# Patient Record
Sex: Male | Born: 1945 | Race: White | Hispanic: No | Smoking: Former smoker
Health system: Southern US, Community
[De-identification: ages and names within clinical notes are randomized; demographics above are authoritative.]

## PROBLEM LIST (undated history)

## (undated) DIAGNOSIS — J8410 Pulmonary fibrosis, unspecified: Secondary | ICD-10-CM

## (undated) DIAGNOSIS — R0609 Other forms of dyspnea: Secondary | ICD-10-CM

## (undated) DIAGNOSIS — C4490 Unspecified malignant neoplasm of skin, unspecified: Secondary | ICD-10-CM

## (undated) DIAGNOSIS — Z77090 Contact with and (suspected) exposure to asbestos: Secondary | ICD-10-CM

## (undated) DIAGNOSIS — Z942 Lung transplant status: Secondary | ICD-10-CM

## (undated) DIAGNOSIS — R0600 Dyspnea, unspecified: Secondary | ICD-10-CM

## (undated) DIAGNOSIS — C3490 Malignant neoplasm of unspecified part of unspecified bronchus or lung: Secondary | ICD-10-CM

## (undated) HISTORY — PX: LUNG TRANSPLANT: SHX234

## (undated) HISTORY — DX: Dyspnea, unspecified: R06.00

## (undated) HISTORY — DX: Contact with and (suspected) exposure to asbestos: Z77.090

## (undated) HISTORY — DX: Other forms of dyspnea: R06.09

## (undated) HISTORY — PX: OTHER SURGICAL HISTORY: SHX169

---

## 2001-05-17 ENCOUNTER — Ambulatory Visit (HOSPITAL_COMMUNITY): Admission: RE | Admit: 2001-05-17 | Discharge: 2001-05-17 | Payer: Self-pay | Admitting: Internal Medicine

## 2001-05-17 ENCOUNTER — Encounter: Payer: Self-pay | Admitting: Internal Medicine

## 2002-08-05 ENCOUNTER — Encounter: Payer: Self-pay | Admitting: Internal Medicine

## 2002-08-05 ENCOUNTER — Ambulatory Visit (HOSPITAL_COMMUNITY): Admission: RE | Admit: 2002-08-05 | Discharge: 2002-08-05 | Payer: Self-pay | Admitting: Internal Medicine

## 2004-06-27 ENCOUNTER — Ambulatory Visit: Payer: Self-pay | Admitting: Internal Medicine

## 2005-06-10 ENCOUNTER — Ambulatory Visit: Payer: Self-pay | Admitting: Internal Medicine

## 2005-06-25 ENCOUNTER — Ambulatory Visit (HOSPITAL_COMMUNITY): Admission: RE | Admit: 2005-06-25 | Discharge: 2005-06-25 | Payer: Self-pay | Admitting: Urology

## 2005-06-25 ENCOUNTER — Encounter (INDEPENDENT_AMBULATORY_CARE_PROVIDER_SITE_OTHER): Payer: Self-pay | Admitting: Specialist

## 2005-06-25 ENCOUNTER — Ambulatory Visit (HOSPITAL_BASED_OUTPATIENT_CLINIC_OR_DEPARTMENT_OTHER): Admission: RE | Admit: 2005-06-25 | Discharge: 2005-06-25 | Payer: Self-pay | Admitting: Urology

## 2006-04-21 ENCOUNTER — Ambulatory Visit: Payer: Self-pay | Admitting: Internal Medicine

## 2006-04-27 ENCOUNTER — Ambulatory Visit: Payer: Self-pay | Admitting: Internal Medicine

## 2006-05-08 ENCOUNTER — Ambulatory Visit: Payer: Self-pay | Admitting: Internal Medicine

## 2006-05-25 ENCOUNTER — Ambulatory Visit: Payer: Self-pay | Admitting: Internal Medicine

## 2006-05-25 ENCOUNTER — Encounter (INDEPENDENT_AMBULATORY_CARE_PROVIDER_SITE_OTHER): Payer: Self-pay | Admitting: Specialist

## 2006-12-16 ENCOUNTER — Ambulatory Visit: Payer: Self-pay | Admitting: Internal Medicine

## 2006-12-16 LAB — CONVERTED CEMR LAB: Vitamin B-12: >1500 pg/mL — ABNORMAL HIGH (ref 211–911)

## 2006-12-29 ENCOUNTER — Ambulatory Visit: Payer: Self-pay | Admitting: Internal Medicine

## 2006-12-29 LAB — CONVERTED CEMR LAB
BUN: 15 mg/dL (ref 6–23)
Creatinine, Ser: 1.2 mg/dL (ref 0.4–1.5)

## 2006-12-30 ENCOUNTER — Ambulatory Visit: Payer: Self-pay | Admitting: Cardiology

## 2007-01-19 ENCOUNTER — Ambulatory Visit: Payer: Self-pay | Admitting: Internal Medicine

## 2007-01-21 ENCOUNTER — Ambulatory Visit: Payer: Self-pay | Admitting: Pulmonary Disease

## 2007-05-12 ENCOUNTER — Ambulatory Visit (HOSPITAL_BASED_OUTPATIENT_CLINIC_OR_DEPARTMENT_OTHER): Admission: RE | Admit: 2007-05-12 | Discharge: 2007-05-12 | Payer: Self-pay | Admitting: Urology

## 2007-05-12 ENCOUNTER — Encounter (INDEPENDENT_AMBULATORY_CARE_PROVIDER_SITE_OTHER): Payer: Self-pay | Admitting: Urology

## 2007-05-18 DIAGNOSIS — Z77090 Contact with and (suspected) exposure to asbestos: Secondary | ICD-10-CM | POA: Insufficient documentation

## 2007-05-18 DIAGNOSIS — R0609 Other forms of dyspnea: Secondary | ICD-10-CM | POA: Insufficient documentation

## 2007-05-18 DIAGNOSIS — R0989 Other specified symptoms and signs involving the circulatory and respiratory systems: Secondary | ICD-10-CM

## 2007-07-13 ENCOUNTER — Ambulatory Visit: Payer: Self-pay | Admitting: Pulmonary Disease

## 2008-12-20 ENCOUNTER — Encounter: Payer: Self-pay | Admitting: Pulmonary Disease

## 2009-07-16 ENCOUNTER — Encounter: Payer: Self-pay | Admitting: Pulmonary Disease

## 2009-07-19 ENCOUNTER — Encounter: Payer: Self-pay | Admitting: Pulmonary Disease

## 2009-08-09 ENCOUNTER — Ambulatory Visit: Payer: Self-pay | Admitting: Pulmonary Disease

## 2009-08-09 DIAGNOSIS — J8410 Pulmonary fibrosis, unspecified: Secondary | ICD-10-CM

## 2009-08-21 ENCOUNTER — Ambulatory Visit: Payer: Self-pay | Admitting: Pulmonary Disease

## 2009-08-21 ENCOUNTER — Telehealth (INDEPENDENT_AMBULATORY_CARE_PROVIDER_SITE_OTHER): Payer: Self-pay | Admitting: *Deleted

## 2009-08-21 LAB — CONVERTED CEMR LAB
Anti Nuclear Antibody(ANA): NEGATIVE
Rheumatoid fact SerPl-aCnc: 20.7 intl units/mL — ABNORMAL HIGH (ref 0.0–20.0)
Sed Rate: 49 mm/hr — ABNORMAL HIGH (ref 0–22)

## 2009-08-29 ENCOUNTER — Encounter: Payer: Self-pay | Admitting: Pulmonary Disease

## 2009-08-29 DIAGNOSIS — G4730 Sleep apnea, unspecified: Secondary | ICD-10-CM | POA: Insufficient documentation

## 2009-09-03 ENCOUNTER — Ambulatory Visit: Payer: Self-pay | Admitting: Pulmonary Disease

## 2009-10-09 ENCOUNTER — Telehealth: Payer: Self-pay | Admitting: Pulmonary Disease

## 2010-04-19 ENCOUNTER — Ambulatory Visit: Payer: Self-pay | Admitting: Pulmonary Disease

## 2010-08-22 ENCOUNTER — Telehealth (INDEPENDENT_AMBULATORY_CARE_PROVIDER_SITE_OTHER): Payer: Self-pay | Admitting: *Deleted

## 2010-08-23 ENCOUNTER — Telehealth (INDEPENDENT_AMBULATORY_CARE_PROVIDER_SITE_OTHER): Payer: Self-pay | Admitting: *Deleted

## 2010-08-27 NOTE — Progress Notes (Signed)
Summary: returned call to Chryl Heck  Phone Note Call from Patient Call back at Work Phone [phone]   Caller: Patient Call For: alva Reason for Call: Talk to Nurse Summary of Call: pt returned call to [facility]  Initial call taken by: Eugene Gavia,  October 09, 2009 2:36 PM  Follow-up for Phone Call        pt informe of RA recs. Zackery Barefoot CMA  October 09, 2009 2:40 PM

## 2010-08-27 NOTE — Assessment & Plan Note (Signed)
Summary: APENA LINK ONLY NOT AN APPT/CB   Allergies: No Known Drug Allergies   Other Orders: EMR Misc Charge Code (EMRMisc)  Appended Document: APENA LINK ONLY NOT AN APPT/CB let him know sleep study showed mild obstructive sleep apnea, no treatment at this time, if remains symptomatic, will pursue in lab study in the future.  Appended Document: APENA LINK ONLY NOT AN APPT/CB LMOMTCB x 1--jwr  Appended Document: APENA LINK ONLY NOT AN APPT/CB LMOMTCB x 2--jwr  Appended Document: APENA LINK ONLY NOT AN APPT/CB Pt informed and stated is currently not having any problems. jwr

## 2010-08-27 NOTE — Miscellaneous (Signed)
Summary: Orders Update pft charges  Clinical Lists Changes  Orders: Added new Service order of Carbon Monoxide diffusing w/capacity (94720) - Signed Added new Service order of Lung Volumes (94240) - Signed Added new Service order of Spirometry (Pre & Post) (94060) - Signed 

## 2010-08-27 NOTE — Assessment & Plan Note (Signed)
Summary: asbestosis exposure/in [facility] office/apc   Primary Provider/Referring Provider:  Dr Garner Nash, [facility]  CC:  Follow up.  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  Marland Kitchen  History of Present Illness: 65 year old Caucasian gentleman who is referred for an abnormal CT scan.  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  A followup CT scan showed a peripheral pattern of subpleural fibrosis and mild honeycombing bilaterally.  There did not seem to be any bibasilar predominance.   Mediastinal lymph nodes were increased in number and measured up to 1.1 cm in the AP window.  I do note a chest x-ray dating back to November 2007 which also shows some foci of fibrotic changes bilaterally.  The peripheral pattern of pulmonary fibrosis can be seen with idiopathic pulmonary fibrosis [usual interstitial pneumonia]. However, there does not seem to be any basal predominance.  The enlarged mediastinal lymph nodes and scattered areas of scarring would also be consistent with asbestos exposure.  There is no evidence of pleural calcification or thickening.  Pulmonary function tests show no evidence of airway obstruction.  His lung volumes are preserved.  Diffusion capacity is moderately decreased to 68%, but corrects for alveolar volume.  This would again be consistent with very early interstitial lung disease.  The honeycombing suggests chronic nature, and radiographically the changes seem to date back at least to November2007.  A pragmatic approach would be to repeat pulmonary function tests and imaging in about six months' time.   August 09, 2009 4:23 PM  [name] to [facility] med for cough & congestion, CXR showed diffuse pulmonary infx. CT chest ([facility]) on 07/26/09 showed diffuse ILD (upper & lower lobes) & again NO calcified pleural  plaques. Reviewed CMET , BUN /cr 20/1.2, LFTs nml, PPD neg. Dyspnea improved, mild dry cough, Dyspnea persists ,may be slightly worse, desaturated to 91% on exertion  Preventive Screening-Counseling & Management  Alcohol-Tobacco     Smoking Status: quit  Current Medications (verified): 1)  Cholesterol Medicine .... 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 pancreatic cancer-father heart attack-brother  Social History: Reviewed history and no changes required. Patient states former smoker.  quit in 1984.  1 1/4 ppd x96yrs alcohol occasionally married 2 children, 2 dogs , no birds Semi-Retired from [employer].  Owns a Administrator, sports business.  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  The patient denies shortness of breath at rest, non-productive cough, coughing up blood, chest pain, irregular heartbeats, acid heartburn, indigestion, loss of appetite, weight change, abdominal pain, difficulty swallowing, sore throat, tooth/dental problems, headaches, nasal congestion/difficulty breathing through nose, sneezing, ear ache, anxiety, depression, hand/feet swelling, rash, change in color of mucus, and fever.    Vital Signs:  Patient profile:   65 year old male Height:      72 inches Weight:      212 pounds BMI:     28.86 O2 Sat:      97 %  on Room air Temp:     98.2 degrees F oral Pulse rate:   96 / minute BP sitting:   124 / 80  (left arm) Cuff size:   regular  Vitals Entered By: Gweneth Dimitri RN (August 09, 2009 4:14 PM)  O2 Flow:  Room air CC: Follow up.  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   Comments Medications reviewed with patient Gweneth Dimitri RN  August 09, 2009 4:15 PM  Ambulatory Pulse Oximetry  Resting; HR_76____    02 Sat_95%ra____  Lap1 (185 feet)   HR_103____   02 Sat__92%ra___ Lap2 (185 feet)   HR_107____   02 Sat_91%ra____    Lap3 (185 feet)   HR__104___   02 Sat_92%ra____  _x__Test Completed without Difficulty ___Test Stopped due to:  Gweneth Dimitri RN  August 09, 2009 5:13 PM     Physical Exam  Additional Exam:  Gen. Pleasant, well-nourished, in no distress, normal affect ENT - no lesions, no post nasal drip Neck: No JVD, no thyromegaly, no carotid bruits Lungs: no use of accessory muscles, no dullness to percussion, Lt basal  rales, no rhonchi  Cardiovascular: Rhythm regular, heart sounds  normal, no murmurs or gallops, no peripheral edema Abdomen: soft and non-tender, no hepatosplenomegaly, BS normal. Musculoskeletal: No deformities, no cyanosis or clubbing Neuro:  alert, non focal     Impression & Recommendations:  Problem # 1:  PULMONARY FIBROSIS (ICD-515) Unclear if this has worsened in the last 2 years the he was lost to FU. Will rpt PFTs to get obkjective comparison. Obtain ANA, ESR, RA factor & ACE level on next visit. if worse, may have to pursue biopsy  Medications Added to Medication List This Visit: 1)  Cholesterol Medicine  .... Once daily  Other Orders: Pulmonary Referral (Pulmonary) Est. Patient Level IV (16109)  Patient Instructions: 1)  Please schedule a follow-up appointment after your breathing test. 2)  You have been asked to make an appointment for a Pulmonary Function Test (Breathing Test) prior to or at the time of your next visit. Use medications as usual unless otherwise instructed.  Appended Document: asbestosis exposure/in [facility] office/[facility] pl leave message for radiology to compare his CT from [facility] (they have access) to  his old CT from 2 yrs ago - re : degree of fibrosis  Appended Document: asbestosis exposure/in [facility] office/apc The numbers I have for Radiology are [phone] and [id number].jwr  Appended Document: asbestosis exposure/in [facility] office/apc fibrosis appears worse - on comparison per radiologist, await PFTs

## 2010-08-27 NOTE — Assessment & Plan Note (Signed)
Summary: follow up with PFTs  // cj   Primary Provider/Referring Provider:  Dr Garner Nash, [facility]  CC:  Pt here for follow-up to discuss PFT results. Pt has no new comlaints at this time. Marland Kitchen  History of Present Illness: 63/M, ex smoker  referred for an abnormal CT scan.  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  There did not seem to be any bibasilar predominance.   Mediastinal lymph nodes were increased in number and measured up to 1.1 cm in the AP window.  I do note a chest x-ray dating back to November 2007 which also shows some foci of fibrotic changes bilaterally.  The peripheral pattern of pulmonary fibrosis can be seen with idiopathic pulmonary fibrosis [usual interstitial pneumonia]. However, there does not seem to be any basal predominance.  The enlarged mediastinal lymph nodes and scattered areas of scarring would also be consistent with asbestos exposure.  There is no evidence of pleural calcification or thickening.  Pulmonary function tests show no evidence of airway obstruction.  His lung volumes are preserved.  Diffusion capacity is moderately decreased to 68%, but corrects for alveolar volume.  This would again be consistent with very early interstitial lung disease.  The honeycombing suggests chronic nature, and radiographically the changes seem to date back at least to November2007.   August 09, 2009 4:23 PM  Micah Flesher to [facility] med for cough & congestion, CXR showed diffuse pulmonary infx. CT chest ([facility]) on 07/26/09 showed diffuse ILD (upper & lower lobes) & again NO calcified pleural plaques. Reviewed CMET , BUN /cr 20/1.2, LFTs nml, PPD neg. Dyspnea improved, mild dry cough, Dyspnea persists ,may be slightly worse, desaturated to 91% on exertion  August 21, 2009 2:37 PM  Reviewed CT with radiologist,  fibrosis appears worse. PFTs however unchanged, preserved lung volumes & DLCO at 68%. Wife reports loud snoring. ESR 49, RA factor pos, ANA neg, Wheezing & congestion persists  Current Medications (verified): 1)  Simvastatin 40 Mg Tabs (Simvastatin) .... Take 1 Tablet By Mouth Once A Day  Allergies (verified): No Known Drug Allergies  Past History:  Past Medical History: Last updated: 08/09/2009 DYSPNEA ON EXERTION (ICD-786.09) HX, PERSONAL, EXPOSURE TO ASBESTOS (ICD-V15.84)  Social History: Last updated: 08/09/2009 Patient states former smoker.  quit in 1984.  1 1/4 ppd x30yrs alcohol occasionally married 2 children, 2 dogs , no birds Semi-Retired from [employer].  Owns a Administrator, sports business.  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  The patient denies anorexia, fever, weight loss, weight gain, vision loss, decreased hearing, hoarseness, chest pain, syncope, peripheral edema, prolonged cough, headaches, hemoptysis, abdominal pain, melena, hematochezia, severe indigestion/heartburn, hematuria, muscle weakness, difficulty walking, depression, unusual weight change, and abnormal bleeding.    Vital Signs:  Patient profile:   65 year old male Height:      72 inches Weight:      208 pounds O2 Sat:      95 % on Room air Temp:     97.6 degrees F oral Pulse rate:   80 / minute BP sitting:   118 / 70  (right arm) Cuff size:   regular  Vitals Entered By: Carron Curie CMA (August 21, 2009 2:02 PM)  O2  Flow:  Room air CC: Pt here for follow-up to discuss PFT results. Pt has no new comlaints at this time.  Comments Medications reviewed with patient Carron Curie CMA  August 21, 2009 2:02 PM Daytime phone number verified with patient.    Physical Exam  Additional Exam:  Gen. Pleasant, well-nourished, in no distress, normal affect ENT - no lesions, no post  nasal drip Neck: No JVD, no thyromegaly, no carotid bruits Lungs: no use of accessory muscles, no dullness to percussion, Lt basal  rales, no rhonchi  Cardiovascular: Rhythm regular, heart sounds  normal, no murmurs or gallops, no peripheral edema Abdomen: soft and non-tender, no hepatosplenomegaly, BS normal. Musculoskeletal: No deformities, no cyanosis or clubbing      Impression & Recommendations:  Problem # 1:  PULMONARY FIBROSIS (ICD-515) While CT appears slightly worse, PFTs are unchanged . As such, I doubt biopsy necessary here at this point. Radilogic pattern is not typical of idiopathic pulmonary fibrosis. Would be consistent with asbestosis but absence of pleurla plaques is surprising. Chk. ANa, ESR, Ra factor  Problem # 2:  DYSPNEA ON EXERTION (ICD-786.09) acute bronchitis superimposed on fibrosis Short course of prednisone. Orders: Est. Patient Level IV (73220)  Problem # 3:  OBSTRUCTIVE SLEEP APNEA (ICD-780.57) [place] study given loud snoring & witnessed apneas by wife. The pathophysiology of obstructive sleep apnea, it's cardiovascular consequences and modes of treatment including CPAP were discussed with the patient in great detail.   Medications Added to Medication List This Visit: 1)  Simvastatin 40 Mg Tabs (Simvastatin) .... Take 1 tablet by mouth once a day 2)  Tessalon 200 Mg Caps (Benzonatate) .... Three times a day as needed 3)  Prednisone 10 Mg Tabs (Prednisone) .... Once daily x 10 days then 5 mg x 10 days  Other Orders: T-Antinuclear Antib (ANA) [phone]) Sleep Disorder Referral (Sleep Disorder) TLB-Rheumatoid Factor (RA) (62831-DV) TLB-Sedimentation Rate (ESR) (85652-ESR) Prescription Created Electronically [phone])  Patient Instructions: 1)  Copy sent to: 2)  Please schedule a follow-up appointment in 3 months. 3)  Blood work today 4)  Prednisone course 5)  Tessalon perels three times a day as needed for cough 6)  RObitussin DM OTC as needed   7)  Portable sleep study Prescriptions: PREDNISONE 10 MG TABS (PREDNISONE) once daily x 10 days then 5 mg x 10 days  #20 x 0   Entered and Authorized by:   Comer Locket Vassie Loll MD   Signed by:   Comer Locket Vassie Loll MD on 08/21/2009   Method used:   Electronically to        [facility] [phone]* (retail)       [address]       [place], [place]  [address]       Ph: [phone] or [phone]       Fax: [phone]   RxID:   [id number] TESSALON 200 MG CAPS (BENZONATATE) three times a day as needed  #60 x 1   Entered and Authorized by:   Comer Locket. Vassie Loll MD   Signed by:   Comer Locket Vassie Loll MD on 08/21/2009   Method used:   [facility]ronically to        [facility] [phone]* (retail)       [address]       [place], [place]  [address]       Ph: [phone] or [phone]  Fax: [phone]   RxID:   [id number]     Appended Document: follow up with PFTs  // cj arrange FU OV pl.  Appended Document: follow up with PFTs  // cj Left message with pt's spouse to have pt call back to schedule follow up. jwr  Appended Document: follow up with PFTs  // cj pt scheduled appointment 04-19-10 at 9:30am. jwr

## 2010-08-27 NOTE — Progress Notes (Signed)
Summary: rx  Phone Note From Pharmacy Call back at [phone]   Call[facility] [phone]* Call For: alva  Summary of Call: have a question re: prednisone qty. Initial call taken by: Eugene Gavia,  August 21, 2009 3:49 PM  Follow-up for Phone Call        Spoke with pharmacy and made aware to give # 15 tablets of prednisone. Follow-up by: Vernie Murders,  August 21, 2009 3:52 PM

## 2010-08-27 NOTE — Assessment & Plan Note (Signed)
Summary: per pt call/mhh   Visit Type:  Follow-up Primary Provider/Referring Provider:  Dr Garner Nash, [facility]  CC:  Pt here for follow up. Pt states not complaints.  History of Present Illness: 63/M, ex smoker  referred for an abnormal CT scan.  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  There did not seem to be any bibasilar predominance. Mediastinal lymph nodes were increased in number and measured up to 1.1 cm in the AP window.  I do note a chest x-ray dating back to November 2007 which also shows some foci of fibrotic changes bilaterally.   The enlarged mediastinal lymph nodes and scattered areas of scarring would also be consistent with asbestos exposure.  There is no evidence of pleural calcification or thickening.  Pulmonary function tests show no evidence of airway obstruction.  His lung volumes are preserved.  Diffusion capacity is moderately decreased to 68%, but corrects for alveolar volume.  This would again be consistent with very early interstitial lung disease.  The honeycombing suggests chronic nature, and radiographically the changes seem to date back at least to November2007.   August 09, 2009 4:23 PM  [name] to [facility] med for cough & congestion, CXR showed diffuse pulmonary infx. CT chest ([facility]) on 07/26/09 showed diffuse ILD (upper & lower lobes) & again NO calcified pleural plaques. Reviewed CMET , BUN /cr 20/1.2, LFTs nml, PPD neg. Dyspnea improved, mild dry cough, Dyspnea persists ,may be slightly worse, desaturated to 91% on exertion  August 21, 2009 2:37 PM  Reviewed CT with radiologist, fibrosis appears worse. PFTs however unchanged, preserved lung volumes & DLCO at 68%. Wife reports loud snoring. ESR 49, RA factor pos, ANA neg, Wheezing & congestion persists Apnealink >> mild obstructive  sleep apnea with [facility] 5/h  April 19, 2010 9:50 AM  Feels well, no dyspnea, cough - discussed study for IPF - he does not want to enroll/ be screened at this time. Had reaction fro flu shot 40 yrs ago but agreeable to retry  Preventive Screening-Counseling & Management  Alcohol-Tobacco     Alcohol drinks/day: <1     Alcohol type: beer/wine     Smoking Status: quit     Packs/Day: 1.5     Year Started: 1960     Year Quit: 1983  Current Medications (verified): 1)  None  Allergies (verified): No Known Drug Allergies  Past History:  Past Medical History: Last updated: 08/09/2009 DYSPNEA ON EXERTION (ICD-786.09) HX, PERSONAL, EXPOSURE TO ASBESTOS (ICD-V15.84)  Social History: Last updated: 08/09/2009 Patient states former smoker.  quit in 1984.  1 1/4 ppd x55yrs alcohol occasionally married 2 children, 2 dogs , no birds Semi-Retired from [employer].  Owns a Administrator, sports business.  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    Vital Signs:  Patient profile:   65 year old male Height:      72 inches Weight:      217.38 pounds BMI:  29.59 O2 Sat:      97 % on Room air Temp:     98.1 degrees F oral Pulse rate:   63 / minute BP sitting:   116 / 60  (left arm) Cuff size:   large  Vitals Entered By: [name] CMA (April 19, 2010 9:44 AM)  O2 Flow:  Room air CC: Pt here for follow up. Pt states not  complaints Comments Medications reviewed with patient Verified contact number and pharmacy with patient [name] CMA  April 19, 2010 9:45 AM    Physical Exam  Additional Exam:  Gen. Pleasant, well-nourished, in no distress, normal affect ENT - no lesions, no post nasal drip Neck: No JVD, no thyromegaly, no carotid bruits Lungs: no use of accessory muscles, no dullness to percussion, Lt basal  rales, no rhonchi  Cardiovascular: Rhythm regular, heart sounds  normal, no murmurs or gallops, no peripheral edema Musculoskeletal: No deformities, no cyanosis or clubbing      CXR  Procedure date:  04/19/2010  Findings:      Comparison: CT dated 12/30/2006   Findings: The lung volumes are low normal.  There is peripheral reticulation and honeycombing seen in the lung periphery involving the lung apices slightly more than the lung bases.  No areas of consolidation are seen.  There are no pleural effusions.  The overall pattern is similar to slightly progressed since the plain film exam of 2007.   IMPRESSION: Unchanged chronic interstitial lung disease without acute findings.  Impression & Recommendations:  Problem # 1:  PULMONARY FIBROSIS (ICD-515) Lung function appears preserved - will monitor annually. CXR today  Does not want to enroll in IPF study/ screen flu shot  Other Orders: Est. Patient Level III (04540) Pulmonary Referral (Pulmonary) Influenza Vaccine MCR (98119) T-2 View CXR (71020TC)  Patient Instructions: 1)  Copy sent to: Dr Garner Nash 2)  Please schedule a follow-up appointment in 6 months. 3)  You have been asked to make an appointment for a Pulmonary Function Test (Breathing Test) prior to or at the time of your next visit. Use medications as usual unless otherwise instructed. 4)  Flu shot 5)  A chest x-ray has been recommended.  Your imaging study may require preauthorization.    Immunization History:  Pneumovax Immunization History:     Pneumovax:  historical (09/03/2009)  Zostavax History:    Zostavax # 1:  zostavax (07/28/2005)  Immunizations Administered:  Influenza Vaccine # 1:    Vaccine Type: Fluvax MCR    Site: left deltoid    Mfr: GlaxoSmithKline    Dose: 0.5 ml    Route: IM    Given by: [name] CMA    Exp. Date: 01/25/2011    Lot #: [id number]    VIS given: 02/19/10 version given April 19, 2010.  Flu Vaccine Consent Questions:    Do you have a history of severe allergic reactions to this vaccine? no    Any prior history of allergic reactions to egg and/or gelatin? no    Do you have a sensitivity to the preservative Thimersol? no    Do you have a past history of Guillan-Barre Syndrome? no    Do you currently have an acute febrile illness? no    Have you ever had a severe reaction to latex? no    Vaccine information given and explained to patient? yes

## 2010-08-27 NOTE — Procedures (Signed)
Summary: ApneaLink  ApneaLink   Imported By: [name] 09/18/2009 10:11:26  _____________________________________________________________________  External Attachment:    Type:   Image     Comment:   External Document

## 2010-08-29 NOTE — Progress Notes (Signed)
Summary: prescription<<<Prednisone RX called to pharmacy  Phone Note Call from Patient   Caller: Patient Call For: [name] Summary of Call: Patient phoned stated that Dr. Vassie Loll has given him prednisone in the past for coughing and congestion. Patient is having the same symptons he has been taking cold and sinus medicine for 3 to 5 days and it isnt helping. Patient wanted to know if Dr. Vassie Loll will [facility] [place]. His wife has been diagnosised with cancer and he doesnt want to get her sick. Patient can be reached at [phone] Initial call taken by: Vedia Coffer,  August 22, 2010 11:48 AM  Follow-up for Phone Call        Spoke with pt.  He is c/o dry "constant cough" x 5 days- states that his breathing is fine, denies wheeze. Would like rx for prednisone.  Will have to forward to doc of the day since RA not in.  Pls advise CY, thanks NKA Follow-up by: Vernie Murders,  August 22, 2010 3:05 PM  Additional Follow-up for Phone Call Additional follow up Details #1::        Per CDY-okay to give Prednisone 10mg  #20 take 4 x 2 days, 3 x 2 days, 2 x 2 days, 1 x 2 days then stop. no refills.Reynaldo Minium CMA  August 22, 2010 4:25 PM   Called, spoke with pt.  States he did pick up pred taper last night and it is starting to help.  He was informed if he starts coughing up green or yellow phelgm to call office back for an abx.  He vebalized understanding of this. Additional Follow-up by: Gweneth Dimitri RN,  August 23, 2010 12:09 PM    Additional Follow-up for Phone Call Additional follow up Details #2::    Pt is aware of Prednisone RX and to call if his sxs do not improve or get worse. I will forward to RA so he is aware.Michel Bickers CMA  August 22, 2010 5:08 PM   OK. If develops green/ yellow phlegm , have him call back for antibiotic RX Follow-up by: Comer Locket. Vassie Loll MD,  August 23, 2010 11:58 AM  New/Updated Medications: PREDNISONE 10 MG TABS (PREDNISONE) 4 by mouth x2  days, 3 by mouth x2 days, 2 by mouth x2 days, 1 by mouth x2 days then stop. Prescriptions: PREDNISONE 10 MG TABS (PREDNISONE) 4 by mouth x2 days, 3 by mouth x2 days, 2 by mouth x2 days, 1 by mouth x2 days then stop.  #20 x 0   Entered by:   Michel Bickers CMA   Authorized by:   Waymon Budge MD   Signed by:   Michel Bickers CMA on 08/22/2010   Method used:   [facility]ically to        [facility]. [phone]* (retail)       [address]       [place], [place]  [address]       Ph: [phone]       Fax: [phone]   RxID:   [id number]

## 2010-08-29 NOTE — Progress Notes (Signed)
Summary: would like antibiotic called in   Phone Note Call from Patient   Caller: Patient Call For: DR ALVA Summary of Call: Patient phoned stated he spoke to the nurse yesterday and he was called in some prednisone but today he is coughing up a greenish mucus. He stated that he was told yesterday if he started coughing up mucus the office would call him in an antibiotic. [facility] [place] [facility] [address]. Patient can be reached at [phone] Initial call taken by: Vedia Coffer,  August 23, 2010 3:25 PM  Follow-up for Phone Call        Per phone note from yesterday, per RA - If develops green/ yellow phlegm , have him call back for antibiotic RX LMOMCTB Crystal Jones RN  August 23, 2010 4:27 PM  Pt returned call.  States he is coughing up greenish yellow mucus - onset this am.  Low grade fever last night 99-100.  Will forward to RA -- pls advie if you would like to give pt abx.    [facility] [place] [facility]  Follow-up by: Gweneth Dimitri RN,  August 23, 2010 4:47 PM  Additional Follow-up for Phone Call Additional follow up Details #1::        avelox 400 once daily x 7  If no b etter by next wk ,arrange OV Additional Follow-up by: Comer Locket. Vassie Loll MD,  August 23, 2010 5:11 PM    Additional Follow-up for Phone Call Additional follow up Details #2::    Spoke with pt and notified that rx was sent for avelox and to call for ov next wk if not improving.  Pt verbalized understanding.  Follow-up by: Vernie Murders,  August 23, 2010 5:16 PM  New/Updated Medications: AVELOX 400 MG TABS (MOXIFLOXACIN HCL) once daily Prescriptions: AVELOX 400 MG TABS (MOXIFLOXACIN HCL) once daily  #7 x 0   Entered and Authorized by:   Comer Locket Vassie Loll MD   Signed by:   Comer Locket Vassie Loll MD on 08/23/2010   Method used:   Electronically to        [facility]. [phone]* (retail)       [address]       [place], [place]  [address]       Ph: [phone]       Fax: [phone]  RxID:   [id number]

## 2010-12-10 NOTE — Op Note (Signed)
NAME:  [name], [name] NO.:  [id number]   MEDICAL RECORD NO.:  [id number]          PATIENT TYPE:  AMB   LOCATION:  [facility]                         FACILITY:  [facility]   PHYSICIAN:  Maretta Bees. Vonita Moss, M.D.DATE OF BIRTH:  1945/08/20   DATE OF PROCEDURE:  DATE OF DISCHARGE:                               OPERATIVE REPORT   PREOPERATIVE DIAGNOSIS:  Meatal stricture, urethral stricture and  chronic balanitis.   POSTOPERATIVE DIAGNOSIS:  Meatal stricture, urethral stricture and  chronic balanitis.   PROCEDURE:  Cystoscopy, urethral dilation and biopsy of glans penis.   SURGEON:  Dr. Larey Dresser   ANESTHESIA:  General.   INDICATIONS:  This 65 year old gentleman has had a past history of  chronic balanitis and has had a previous biopsy showing no evidence of  malignancy of the glans penis.  He has urethral stricture disease and he  saw Dr. Vernie Ammons on September 29 and he had an increased residual urine  and had dilation only up to 14 [place] and small Foley catheter was put  in.  I removed that the other day because it was uncomfortable for him  and he said he voided somewhat better but still has significant disease.   PROCEDURE IN DETAIL:  The patient was brought to the operating room,  placed in the lithotomy position.  External genitalia were prepped and  draped in the usual fashion.  He had some dry, somewhat indurated,  leathery skin on the glans penis without a definitive lesion, erythema  or ulceration.  His meatus was tight to 16-French sound and he was a  dilated to 28-French.  I then inserted cystoscope and saw some proximal  stricture disease and dilated him up to 24-French.  I then inserted the  cystoscope further and noted that he has pretty much panurethral disease  that gets better in the deep bulb.  Prostate has small lateral lobes.  Bladder was trabeculated but no intravesical lesions.  A 20-French Foley  was then inserted and connected subsequently to  closed drainage.   I then did a wedge biopsy of the glans penis to the right of the meatus  and used needle electrode to fulgurate the base and close this with  interrupted 4-0 chromic catgut.  The patient was then taken to the  recovery room in good condition with blood loss of 5 mL or less.  He  tolerated the procedure well, was taken to recovery room.   I am going to see him in followup and it may be appropriate for him to  undergo evaluation for urethroplasty or a stent and we will see what his  biopsy shows of the penis.  His last biopsy showed benign lentigo.      Maretta Bees. Vonita Moss, M.D.  Electronically Signed     LJP/MEDQ  D:  05/12/2007  T:  05/13/2007  Job:  [id number]

## 2010-12-10 NOTE — Letter (Signed)
January 21, 2007    Rosalyn Gess. Norins, MD  [address]  [place], [place]  [address]   RE:  [name], [name]  MRN:  [id number]  /  DOB:  06/11/1946   Dear Dr. Debby Bud:   As you are aware Mr. Ponder is a 65 year old Caucasian gentleman who  is referred for an abnormal CT scan.  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  A followup CT scan showed a peripheral  pattern of subpleural fibrosis and mild honeycombing bilaterally.  There  did not seem to be any bibasilar predominance.  I have reviewed these  films.  Mediastinal lymph nodes were increased in number and measured up  to 1.1 cm in the AP window.  Hence we are consulted.  I do note a chest  x-ray dating back to November 2007 which also shows some foci of  fibrotic changes bilaterally.   The patient currently denies dyspnea, cough, wheezing or chest pain.   PAST MEDICAL HISTORY:  Traumatic rib fracture after a fall in 2001 with  left pneumothorax requiring a chest tube.   PAST SURGICAL HISTORY:  None.   ALLERGIES:  None.   CURRENT MEDICATIONS:  None.   SOCIAL HISTORY:  He quit smoking in 1983.  Smoked about a pack and a  half per day for 15 years.  He drinks socially.  He is married and lives  with his family.  In his job he has been exposed to asbestos pipes.  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   FAMILY HISTORY:  His father died of intestinal cancer.   REVIEW OF SYSTEMS:  Denies dyspnea, cough, weight loss, fevers.   PHYSICAL EXAMINATION:  VITAL SIGNS:  Weight 214 pounds, afebrile, blood  pressure 110/82, heart rate 62, oxygen saturation 95% on room air.  HEENT:  Normal.  NECK:  Supple.  No JVD.  No lymphadenopathy.  CV:  S1, S2 normal.  CHEST:  Clear to auscultation.  ABDOMEN:  Soft, nontender.  EXTREMITIES:  No edema.   IMAGING:  I have reviewed his  films.   IMPRESSION/PLAN:  The peripheral pattern of pulmonary fibrosis can be  seen with idiopathic pulmonary fibrosis [usual interstitial pneumonia].  However, there does not seem to be any basal predominance.  The enlarged  mediastinal lymph nodes and scattered areas of scarring would also be  consistent with asbestos exposure.  There is no evidence of pleural  calcification or thickening.  Pulmonary function tests show no evidence  of airway obstruction.  His lung volumes are preserved.  Diffusion  capacity is moderately decreased to 68%, but corrects for alveolar  volume.  This would again be consistent with very early interstitial  lung disease.  The honeycombing suggests chronic nature, and  radiographically the changes seem to date back at least to November  2007.   I discussed the above findings with the patient.  At this point I think  it is too early to intervene, and I doubt that even a surgical lung  biopsy would have any yield.  Again these may be all consistent with his  history of asbestos exposure in which case I would not expect any  worsening.  A pragmatic approach would be to repeat pulmonary function  tests and imaging in about six months' time.  If there is  any  deterioration we will consider biopsy. If not, we will just take a wait  and watch approach.  We will keep you informed as to his progress.    Sincerely,      Oretha Milch, MD  Electronically Signed    RVA/MedQ  DD: 01/21/2007  DT: 01/21/2007  Job #: [id number]

## 2010-12-13 NOTE — Assessment & Plan Note (Signed)
[facility]                             PRIMARY CARE OFFICE NOTE   NAME:[name], [name]                   MRN:          [id number]  DATE:04/27/2006                            DOB:          11-01-1945    Mr. [name] is a 65 year old gentleman who presents for routine follow-up  evaluation and exam.  I last saw this patient on Dec 26, 2003.  Please see  that complete dictation for past medical history, family history, and social  history.   In the interval, patient was seen on June 10, 2005 by Dr. Artist Pais for  urethritis and question of meatal obstruction.  He was subsequently referred  to Dr. Larey Dresser, who then took the patient to the operating room on  June 25, 2005 for a cystoscopy, urethral meatotomy, urethral dilatation,  and biopsy of the glans penis.  Since that time, the patient has had one  episode of infection but otherwise has done well.   CURRENT MEDICATIONS:  No prescription drugs.  Patient takes occasional  ibuprofen.   REVIEW OF SYSTEMS:  Negative for any constitutional problems.  It has been  greater than 18 months since he had my exam.  Patient does have an upper  bridge but no other dental problems.  No cardiovascular, respiratory  complaints.  No GI problems.  GU is per Dr. Vonita Moss and stable.  No  musculoskeletal complaints except for mild arthralgias.  No dermatologic or  neurologic problems.   PHYSICAL EXAMINATION:  VITAL SIGNS:  Temperature was 98.2, blood pressure  122/76, pulse 69, weight 200 pounds.  GENERAL APPEARANCE:  This is a well-developed and well-nourished gentleman  in no acute distress.  HEENT:  Normocephalic and atraumatic.  EACs and TM's were normal.  Oropharynx with native dentition at good repair.  No buccal or palatal  lesions were noted.  Posterior pharynx was clear.  Conjunctivae and sclerae  are clear.  PERRLA.  EOMI.  Funduscopic exam was unremarkable.  NECK:  Supple without  thyromegaly.  NODES:  No adenopathy was noted in the cervical and supraclavicular regions.  CHEST:  No CVA tenderness.  LUNGS:  Clear to auscultation and percussion.  CARDIOVASCULAR:  Radial pulses 2+.  No JVD or carotid bruits.  He had a  quiet precordium with a regular rate and rhythm without murmurs, rubs or  gallops.  ABDOMEN:  Soft.  No guarding or rebound.  No organosplenomegaly was noted.  GENITALIA:  Uncircumcised male.  Bilaterally descended testicles without  masses.  RECTAL:  Normal sphincter tone was noted.  Prostate was smooth without  normal size and contour without nodules.  EXTREMITIES:  Without clubbing, cyanosis, edema, or deformity.  NEUROLOGIC:  Nonfocal.   DATA BASE:  Hemoglobin 13 gm, white count 6600 with a normal differential.  Cholesterol 167, triglycerides 138, HDL 24.8, LDL 115.  Chemistries with a  serum glucose of 108.  Electrolytes were normal.  Kidney function and liver  functions were normal.  Thyroid function normal with a TSH of 1.86.  TSA was  normal at 0.62.  Urinalysis was negative except for 5-10 WBCs per high-  powered field, 1+ bacteria.   A 12-lead electrocardiogram revealed the patient to have a normal sinus  rhythm, T wave inversion in V1 with small R waves, but I do not believe  there is any evidence of myocardial ischemia or infarction.   This is a very pleasant gentleman who has a normal physical exam, who has  low risk for heart disease, who is generally doing well.  I would recommend  that he exercise more in order to help raise his HDL with an LDL being at  goal.  Patient has had colonoscopy referral in the past but was unable to  get this scheduled.  Will therefore refer him again to GI for colonoscopy.  Patient is otherwise asked to return to see me in two years for routine  follow-up evaluation and exam.            ______________________________  Rosalyn Gess. Norins, MD      MEN/MedQ  DD:  04/27/2006  DT:  04/28/2006  Job  #:  [id number]   cc:   Seleta Rhymes

## 2010-12-13 NOTE — Op Note (Signed)
NAME:  [name], [name] NO.:  [id number]   MEDICAL RECORD NO.:  [id number]          PATIENT TYPE:  AMB   LOCATION:  [facility]                         FACILITY:  [facility]   PHYSICIAN:  Maretta Bees. Vonita Moss, M.D.DATE OF BIRTH:  [birth year]   DATE OF PROCEDURE:  06/25/2005  DATE OF DISCHARGE:                                 OPERATIVE REPORT   PREOPERATIVE DIAGNOSIS:  Chronic balanitis and meatal stenosis.   POSTOPERATIVE DIAGNOSIS:  Chronic balanitis and meatal stenosis. Urethral  stricture.   PROCEDURE:  Cystoscopy, urethral meatotomy, urethral dilation and biopsy of  glans penis.   SURGEON:  Dr. Larey Dresser.   ANESTHESIA:  General.   INDICATIONS:  This 65 year old gentleman had a remote history of  circumcision for what sounds like phimosis and adhesions to the glans penis.  He has had a history of the urethral meatal stenosis with straining to void  and small voidings. He is brought to the OR today for further evaluation and  correction of these problems.   DESCRIPTION OF PROCEDURE:  The patient is brought to the operating room,  placed in lithotomy position, external genitalia were prepped and draped in  the usual fashion. A biopsy was taken from the left side of the glans penis  and sent to pathology and the biopsy site coagulated with the handheld  battery-powered cautery unit. I then performed a meatotomy by placing a  straight clamp on the ventral aspect of the glans penis and it was very  thick and fibrotic tissue. I then used to hook blade knife to open this up  and later on fulgurated the meatotomy site with the handheld cautery unit as  noted above. I then tried to cystoscope him, but the penile urethra was too  narrow so I had to dilate him from 16 to 8 [place] with [employer] sounds  through a lengthy tight penile urethral stricture. I then cystoscoped him  and it was obvious that he had a narrowed urethra going back at least 5 or 6  cm proximal to the  meatus. There was no deep bulbous urethral stricture. The  prostate had partial obstruction, the bladder was unremarkable. I then  removed the cystoscoped and inserted an 18-French Foley catheter which I  will have him wear overnight and the catheter was connected to closed  drainage and he was taken to the recovery room in good condition having  tolerated the procedure well with minimal blood loss.      Maretta Bees. Vonita Moss, M.D.  Electronically Signed     LJP/MEDQ  D:  06/25/2005  T:  06/26/2005  Job:  [id number]   cc:   Thomos Lemons, D.O. [facility]  [address] [place], [place] [address]

## 2011-04-02 ENCOUNTER — Encounter: Payer: Self-pay | Admitting: Pulmonary Disease

## 2011-04-03 ENCOUNTER — Ambulatory Visit (INDEPENDENT_AMBULATORY_CARE_PROVIDER_SITE_OTHER)
Admission: RE | Admit: 2011-04-03 | Discharge: 2011-04-03 | Disposition: A | Discharge status: Home / Self Care | Payer: Medicare Other | Source: Ambulatory Visit | Attending: Pulmonary Disease | Admitting: Pulmonary Disease

## 2011-04-03 ENCOUNTER — Encounter: Payer: Self-pay | Admitting: Pulmonary Disease

## 2011-04-03 ENCOUNTER — Ambulatory Visit (INDEPENDENT_AMBULATORY_CARE_PROVIDER_SITE_OTHER): Payer: Medicare Other | Admitting: Pulmonary Disease

## 2011-04-03 VITALS — BP 108/62 | HR 67 | Temp 98.2°F | Ht 72.0 in | Wt 204.4 lb

## 2011-04-03 DIAGNOSIS — J8410 Pulmonary fibrosis, unspecified: Secondary | ICD-10-CM

## 2011-04-03 DIAGNOSIS — Z23 Encounter for immunization: Secondary | ICD-10-CM

## 2011-04-03 NOTE — Patient Instructions (Signed)
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

## 2011-04-03 NOTE — Progress Notes (Signed)
  Subjective:    Patient ID: [name], male    DOB: 11-Oct-1945, 65 y.o.   MRN: [id number]  HPI 65/M, ex smoker for FU of ILD. 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 There did not seem to be any bibasilar predominance. Mediastinal lymph nodes were increased in number and measured up to 1.1 cm in the AP window. I do note a chest x-ray dating back to November 2007 which also shows some foci of fibrotic changes bilaterally.  The enlarged mediastinal lymph nodes and scattered areas of scarring could be consistent with asbestos exposure. There is no evidence of pleural calcification or thickening. Pulmonary function tests show no evidence of airway obstruction. His lung volumes are preserved. Diffusion capacity is moderately decreased to 68%, but corrects for alveolar volume. This is consistent with  interstitial lung disease. The honeycombing suggests chronic nature, and radiographically the changes seem to date back at least to November2007.   August 09, 2009  Went to [facility] med for cough & congestion, CXR showed diffuse pulmonary infx. CT chest ([place]) on 07/26/09 showed diffuse ILD (upper & lower lobes) & again NO calcified pleural plaques>> Reviewed CT with radiologist, fibrosis appears worse. Reviewed CMET , BUN /cr 20/1.2, LFTs nml, PPD neg.  Desaturated to 91% on exertion , PFTs however unchanged, preserved lung volumes & DLCO at 68%. Wife reports loud snoring. ESR 49, RA factor pos, ANA neg, Wheezing & congestion persists  Apnealink >> mild obstructive sleep apnea with AHi 5/h     04/03/2011 1 yr FU Feels well, no dyspnea, cough occasional , dry - discussed study for IPF - he is hesitant to enroll but agrees to be screened & discuss more.No reaction to flu shot last yr & agreeable this yr. Desaturates to 90% on walking 3  laps. CXR -s table fibrosis    Review of Systems Patient denies significant dyspnea,cough, hemoptysis,  chest pain, palpitations, pedal edema, orthopnea, paroxysmal nocturnal dyspnea, lightheadedness, nausea, vomiting, abdominal or  leg pains      Objective:   Physical Exam  Gen. Pleasant, well-nourished, in no distress ENT - no lesions, no post nasal drip Neck: No JVD, no thyromegaly, no carotid bruits Lungs: no use of accessory muscles, no dullness to percussion, bibasal fine rales, no rhonchi  Cardiovascular: Rhythm regular, heart sounds  normal, no murmurs or gallops, no peripheral edema Musculoskeletal: No deformities, no cyanosis or clubbing        Assessment & Plan:

## 2011-04-03 NOTE — Assessment & Plan Note (Signed)
Radiologically dates back to 2007. H/o asbestos exposure CXR today appears stable Obtain PFts & compare , clinically does not appear worse. Screen for ASCEND trial

## 2011-04-07 ENCOUNTER — Telehealth: Payer: Self-pay | Admitting: Pulmonary Disease

## 2011-04-07 NOTE — Telephone Encounter (Signed)
lmomtcb  Scarring is stable & unchanged from prior

## 2011-04-07 NOTE — Progress Notes (Signed)
Quick Note:  I informed pt of RA's findings and recommendations. Pt verbalized understanding. Kalob Bergen W. Jamar Casagrande, CMA  ______ 

## 2011-04-08 NOTE — Telephone Encounter (Signed)
lmomtcb  

## 2011-04-08 NOTE — Telephone Encounter (Signed)
I spoke with patient about results and he verbalized understanding and had no questions 

## 2011-04-14 ENCOUNTER — Ambulatory Visit (INDEPENDENT_AMBULATORY_CARE_PROVIDER_SITE_OTHER): Payer: Medicare Other | Admitting: Pulmonary Disease

## 2011-04-14 DIAGNOSIS — J8410 Pulmonary fibrosis, unspecified: Secondary | ICD-10-CM

## 2011-04-14 NOTE — Progress Notes (Signed)
PFT done today. 

## 2011-04-18 ENCOUNTER — Telehealth: Payer: Self-pay | Admitting: Pulmonary Disease

## 2011-04-18 NOTE — Telephone Encounter (Signed)
PFTs show lung volumes unchanged - diffusion mild decrease when compared to jan'11 No change in plan

## 2011-04-21 NOTE — Telephone Encounter (Signed)
PATIENT WANTS RESULTS OF BREATHING TEST DONE 1 WEEK AGO.  PLEASE CALL [phone]

## 2011-04-21 NOTE — Telephone Encounter (Signed)
Called, spoke with pt.  I informed him PFTs show lung volumes unchanged, diffusion mild decrease when compared to jan 2011 per RA.  He was also informed RA recs no change in plans at this time.  He verbalized understanding of these results and recs and voiced no further questions/concerns at this time.

## 2011-04-23 ENCOUNTER — Encounter: Payer: Self-pay | Admitting: Pulmonary Disease

## 2011-09-15 ENCOUNTER — Telehealth: Payer: Self-pay | Admitting: Pulmonary Disease

## 2011-09-15 NOTE — Telephone Encounter (Signed)
Patient calling to be sure he does not need PT's on same day as OV,10/02/11, or before. I do not see any record of repeat PFT being needed at OV but will forward to RA to makes sure of this. Pt says he is doing well and having no problems. RA pls advise.

## 2011-09-16 NOTE — Telephone Encounter (Signed)
Will assess on OV & decide need for PFT

## 2011-09-16 NOTE — Telephone Encounter (Signed)
Spoke with pt and notified of recs per RA. Pt verbalized understanding and states no further questions.

## 2011-10-02 ENCOUNTER — Ambulatory Visit: Payer: Medicare Other | Admitting: Pulmonary Disease

## 2011-11-13 ENCOUNTER — Ambulatory Visit: Payer: Medicare Other | Admitting: Pulmonary Disease

## 2011-12-04 ENCOUNTER — Ambulatory Visit: Payer: Medicare Other | Admitting: Pulmonary Disease

## 2012-01-22 ENCOUNTER — Ambulatory Visit (INDEPENDENT_AMBULATORY_CARE_PROVIDER_SITE_OTHER): Payer: Medicare Other | Admitting: Pulmonary Disease

## 2012-01-22 ENCOUNTER — Encounter: Payer: Self-pay | Admitting: Pulmonary Disease

## 2012-01-22 VITALS — BP 128/70 | HR 83 | Temp 98.0°F | Ht 72.0 in | Wt 212.0 lb

## 2012-01-22 DIAGNOSIS — J449 Chronic obstructive pulmonary disease, unspecified: Secondary | ICD-10-CM

## 2012-01-22 DIAGNOSIS — J8410 Pulmonary fibrosis, unspecified: Secondary | ICD-10-CM

## 2012-01-22 DIAGNOSIS — G4730 Sleep apnea, unspecified: Secondary | ICD-10-CM

## 2012-01-22 NOTE — Patient Instructions (Addendum)
PULMONARY FIBROSIS appears stable Schedule PFTs & CXR next visit Take ZYRTEC during fall

## 2012-01-22 NOTE — Progress Notes (Signed)
  Subjective:    Patient ID: [name], male    DOB: Jun 08, 1946, 66 y.o.   MRN: [id number]  HPI 65/M, ex smoker for FU of ILD.  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 There did not seem to be any bibasilar predominance. Mediastinal lymph nodes were increased in number and measured up to 1.1 cm in the AP window. I do note a chest x-ray dating back to November 2007 which also shows some foci of fibrotic changes bilaterally.  The enlarged mediastinal lymph nodes and scattered areas of scarring could be consistent with asbestos exposure. There is no evidence of pleural calcification or thickening. Pulmonary function tests show no evidence of airway obstruction. His lung volumes are preserved. Diffusion capacity is moderately decreased to 68%, but corrects for alveolar volume. This is consistent with interstitial lung disease. The honeycombing suggests chronic nature, and radiographically the changes seem to date back at least to November2007.   August 09, 2009  Went to [facility] med for cough & congestion, CXR showed diffuse pulmonary infx. CT chest ([facility]) on 07/26/09 showed diffuse ILD (upper & lower lobes) & again NO calcified pleural plaques>> Reviewed CT with radiologist, fibrosis appears worse.  Reviewed CMET , BUN /cr 20/1.2, LFTs nml, PPD neg.  Desaturated to 91% on exertion , PFTs however unchanged, preserved lung volumes & DLCO at 68%. Wife reports loud snoring. ESR 49, RA factor pos, ANA neg, Wheezing & congestion persists  Apnealink >> mild obstructive sleep apnea with AHi 5/h   04/03/2011 1 yr FU  Feels well, no dyspnea, cough occasional , dry - discussed study for IPF - he is hesitant to enroll but agrees to be screened & discuss more.No reaction to flu shot last yr & agreeable this yr.  Desaturates to 90% on walking 3 laps.   CXR -s table fibrosis  01/22/2012 CXR 9/12- stable scarring PFTs 9/12 showed lung volumes unchanged - diffusion mild decrease to 61%when compared to jan'11 Rough winter ,cold air - cough , better in the summer C/o allergy symptoms last fall Pt reports he gets "short winded" with mod activity, sometimes a prod cough w yellowish mucus Desatn to 91% on walking 3 laps, not keen on pulm rehab, hoping to retire this year Was not accepted into [facility] trial  Review of Systems Pt denies any significant  nasal congestion or excess secretions, fever, chills, sweats, unintended wt loss, pleuritic or exertional cp, orthopnea pnd or leg swelling.  Pt also denies any obvious fluctuation in symptoms with weather or environmental change or other alleviating or aggravating factors.    Pt denies any increase in rescue therapy over baseline, denies waking up needing it or having early am exacerbations or coughing/wheezing/ or dyspnea  Patient denies significant  hemoptysis,  chest pain, palpitations, pedal edema, orthopnea, paroxysmal nocturnal dyspnea, lightheadedness, nausea, vomiting, abdominal or  leg pains      Objective:   Physical Exam  Gen. Pleasant, well-nourished, in no distress ENT - no lesions, no post nasal drip Neck: No JVD, no thyromegaly, no carotid bruits Lungs: no use of accessory muscles, no dullness to percussion, bibasal rales, no rhonchi  Cardiovascular: Rhythm regular, heart sounds  normal, no murmurs or gallops, no peripheral edema Musculoskeletal: No deformities, no cyanosis or clubbing         Assessment & Plan:

## 2012-01-23 NOTE — Assessment & Plan Note (Signed)
Borderline- AHI 5/h No rx at this time- asymptomatic

## 2012-01-23 NOTE — Assessment & Plan Note (Signed)
PULMONARY FIBROSIS appears stable clinically Schedule PFTs & CXR next visit Take ZYRTEC during fall

## 2012-03-30 ENCOUNTER — Ambulatory Visit (HOSPITAL_BASED_OUTPATIENT_CLINIC_OR_DEPARTMENT_OTHER)
Admission: RE | Admit: 2012-03-30 | Discharge: 2012-03-30 | Disposition: A | Discharge status: Home / Self Care | Payer: Medicare Other | Source: Ambulatory Visit | Attending: Adult Health | Admitting: Adult Health

## 2012-03-30 ENCOUNTER — Ambulatory Visit: Payer: Medicare Other | Admitting: Pulmonary Disease

## 2012-03-30 ENCOUNTER — Encounter: Payer: Self-pay | Admitting: Adult Health

## 2012-03-30 ENCOUNTER — Ambulatory Visit (INDEPENDENT_AMBULATORY_CARE_PROVIDER_SITE_OTHER): Payer: Medicare Other | Admitting: Adult Health

## 2012-03-30 VITALS — BP 104/60 | HR 71 | Temp 97.8°F | Ht 72.0 in | Wt 209.0 lb

## 2012-03-30 DIAGNOSIS — J8410 Pulmonary fibrosis, unspecified: Secondary | ICD-10-CM | POA: Insufficient documentation

## 2012-03-30 DIAGNOSIS — R05 Cough: Secondary | ICD-10-CM | POA: Insufficient documentation

## 2012-03-30 DIAGNOSIS — R059 Cough, unspecified: Secondary | ICD-10-CM | POA: Insufficient documentation

## 2012-03-30 MED ORDER — AZITHROMYCIN 250 MG PO TABS: ORAL_TABLET | ORAL | Status: AC

## 2012-03-30 MED ORDER — PREDNISONE 10 MG PO TABS: ORAL_TABLET | ORAL | Status: DC

## 2012-03-30 NOTE — Patient Instructions (Addendum)
Zpack take as directed.  Prednisone taper over next week.  Mucinex DM Twice daily  As needed  Cough/congestion  Claritin daily As needed  Drainage  Please contact office for sooner follow up if symptoms do not improve or worsen or seek emergency care  follow up Dr. Vassie Loll  In 2 months as planned  I will call with xray results.

## 2012-03-30 NOTE — Assessment & Plan Note (Signed)
Flare with URI   Plan  Zpack take as directed.  Prednisone taper over next week.  Mucinex DM Twice daily  As needed  Cough/congestion  Claritin daily As needed  Drainage  Please contact office for sooner follow up if symptoms do not improve or worsen or seek emergency care  follow up Dr. Vassie Loll  In 2 months as planned  I will call with xray results.

## 2012-03-30 NOTE — Progress Notes (Signed)
Patient ID: [name], male   DOB: 1946/01/16, 66 y.o.   MRN: [id number]  Subjective:    Patient ID: [name], male    DOB: 1946/06/04, 66 y.o.   MRN: [id number]  HPI 66/M, ex smoker for FU of ILD.  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 There did not seem to be any bibasilar predominance. Mediastinal lymph nodes were increased in number and measured up to 1.1 cm in the AP window. I do note a chest x-ray dating back to November 2007 which also shows some foci of fibrotic changes bilaterally.  The enlarged mediastinal lymph nodes and scattered areas of scarring could be consistent with asbestos exposure. There is no evidence of pleural calcification or thickening. Pulmonary function tests show no evidence of airway obstruction. His lung volumes are preserved. Diffusion capacity is moderately decreased to 68%, but corrects for alveolar volume. This is consistent with interstitial lung disease. The honeycombing suggests chronic nature, and radiographically the changes seem to date back at least to November2007.   August 09, 2009  Went to [facility] med for cough & congestion, CXR showed diffuse pulmonary infx. CT chest ([facility]) on 07/26/09 showed diffuse ILD (upper & lower lobes) & again NO calcified pleural plaques>> Reviewed CT with radiologist, fibrosis appears worse.  Reviewed CMET , BUN /cr 20/1.2, LFTs nml, PPD neg.  Desaturated to 91% on exertion , PFTs however unchanged, preserved lung volumes & DLCO at 68%. Wife reports loud snoring. ESR 49, RA factor pos, ANA neg, Wheezing & congestion persists  Apnealink >> mild obstructive sleep apnea with AHi 5/h   04/03/2011 1 yr FU  Feels well, no dyspnea, cough occasional , dry - discussed study for IPF - he is hesitant to enroll but agrees to be screened & discuss more.No reaction  to flu shot last yr & agreeable this yr.  Desaturates to 90% on walking 3 laps.  CXR -s table fibrosis  01/22/2012 CXR 9/12- stable scarring PFTs 9/12 showed lung volumes unchanged - diffusion mild decrease to 61%when compared to jan'11 Rough winter ,cold air - cough , better in the summer C/o allergy symptoms last fall Pt reports he gets "short winded" with mod activity, sometimes a prod cough w yellowish mucus Desatn to 91% on walking 3 laps, not keen on pulm rehab, hoping to retire this year Was not accepted into [facility] trial  03/30/2012 Acute OV  Complains of c/o cough with light mucus x 3 weeks. Also, sob, wheezing.  Denies chest pain and chest tightness.  No recent travel or abx use.  OTC not helping , claritin.  No fever . No hemoptysis .  No new meds.  Humidity and dust are making cough worse.  No coughing at night . Cough is worse in am.    ROS:  Constitutional:   No  weight loss, night sweats,  Fevers, chills, fatigue, or  lassitude.  HEENT:   No headaches,  Difficulty swallowing,  Tooth/dental problems, or  Sore throat,                No sneezing, itching, ear ache, nasal congestion,  +post nasal drip,   CV:  No chest pain,  Orthopnea, PND, swelling in lower extremities, anasarca, dizziness, palpitations, syncope.   GI  No heartburn, indigestion, abdominal pain, nausea, vomiting, diarrhea, change in bowel habits, loss of appetite, bloody stools.  Resp: No shortness of breath with exertion or at rest.   No coughing up of blood.    No chest wall deformity  Skin: no rash or lesions.  GU: no dysuria, change in color of urine, no urgency or frequency.  No flank pain, no hematuria   MS:  No joint pain or swelling.  No decreased range of motion.  No back pain.  Psych:  No change in mood or affect. No depression or anxiety.  No memory loss.         Objective:   Physical Exam  Gen. Pleasant, well-nourished, in no distress ENT - no lesions, no post nasal  drip Neck: No JVD, no thyromegaly, no carotid bruits Lungs: no use of accessory muscles, no dullness to percussion, bibasal rales, no rhonchi  Cardiovascular: Rhythm regular, heart sounds  normal, no murmurs or gallops, no peripheral edema Musculoskeletal: No deformities, no cyanosis or clubbing         Assessment & Plan:

## 2012-05-12 ENCOUNTER — Telehealth: Payer: Self-pay | Admitting: Pulmonary Disease

## 2012-05-12 NOTE — Telephone Encounter (Signed)
I spoke with pt and he stated his cough is not any better. He is coughing a lot but can't get anything up. I scheduled pt an appt to see RA 05/13/12 at 1:30 for acute visit. Nothing further was needed

## 2012-05-13 ENCOUNTER — Encounter: Payer: Self-pay | Admitting: Pulmonary Disease

## 2012-05-13 ENCOUNTER — Ambulatory Visit (INDEPENDENT_AMBULATORY_CARE_PROVIDER_SITE_OTHER): Payer: Medicare Other | Admitting: Pulmonary Disease

## 2012-05-13 VITALS — BP 114/60 | HR 82 | Temp 98.1°F | Ht 72.0 in | Wt 209.0 lb

## 2012-05-13 DIAGNOSIS — Z23 Encounter for immunization: Secondary | ICD-10-CM

## 2012-05-13 DIAGNOSIS — J8410 Pulmonary fibrosis, unspecified: Secondary | ICD-10-CM

## 2012-05-13 MED ORDER — BENZONATATE 200 MG PO CAPS: 200.0000 mg | ORAL_CAPSULE | Freq: Three times a day (TID) | ORAL | Status: DC | PRN

## 2012-05-13 NOTE — Progress Notes (Signed)
  Subjective:    Patient ID: [name], male    DOB: [birth year], 66 y.o.   MRN: [id number]  HPI 66/M, ex smoker for FU of ILD.  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 There did not seem to be any bibasilar predominance. Mediastinal lymph nodes were increased in number and measured up to 1.1 cm in the AP window. Of note,  a chest x-ray dating back to November 2007 showed some foci of fibrotic changes bilaterally.  The enlarged mediastinal lymph nodes and scattered areas of scarring could be consistent with asbestos exposure. There is no evidence of pleural calcification or thickening. Pulmonary function tests showed no evidence of airway obstruction. His lung volumes were preserved. Diffusion capacity was moderately decreased to 68%, but corrects for alveolar volume. This was consistent with interstitial lung disease. The honeycombing suggested chronic nature, and radiographically the changes date back at least to November2007.   August 09, 2009  Went to [facility] med for cough & congestion, CXR showed diffuse pulmonary infx. CT chest ([facility]) on 07/26/09 showed diffuse ILD (upper & lower lobes) & again NO calcified pleural plaques>> Reviewed CT with radiologist, fibrosis appears worse.  Reviewed CMET , BUN /cr 20/1.2, LFTs nml, PPD neg.  Desaturated to 91% on exertion , PFTs however unchanged, preserved lung volumes & DLCO at 68%. Wife reports loud snoring. ESR 49, RA factor pos, ANA neg, Wheezing & congestion persists  Apnealink >> mild obstructive sleep apnea with [facility] 5/h      05/13/2012  03/30/2012 Acute OV  For flare with URI  -prednisone Cough is no better PFTs 9/12 showed lung volumes unchanged - diffusion mild decrease to 61%when compared to jan'11  Rough winter ,cold air - cough , better in the summer  C/o allergy  symptoms last fall  Pt reports he gets "short winded" with mild activity such as playing with his dog Desatn to 90% on walking 3 laps, not keen on pulm rehab, hoping to retire this year  Main complaint now is cough ,Humidity and dust are making cough worse.  No coughing at night .  Cough is worse in am.       Review of Systems neg for any significant sore throat, dysphagia, itching, sneezing, nasal congestion or excess/ purulent secretions, fever, chills, sweats, unintended wt loss, pleuritic or exertional cp, hempoptysis, orthopnea pnd or change in chronic leg swelling. Also denies presyncope, palpitations, heartburn, abdominal pain, nausea, vomiting, diarrhea or change in bowel or urinary habits, dysuria,hematuria, rash, arthralgias, visual complaints, headache, numbness weakness or ataxia.     Objective:   Physical Exam  Gen. Pleasant, well-nourished, in no distress ENT - no lesions, no post nasal drip Neck: No JVD, no thyromegaly, no carotid bruits Lungs: no use of accessory muscles, no dullness to percussion, left basal rales, no rhonchi  Cardiovascular: Rhythm regular, heart sounds  normal, no murmurs or gallops, no peripheral edema Musculoskeletal: No deformities, no cyanosis or clubbing        Assessment & Plan:

## 2012-05-13 NOTE — Assessment & Plan Note (Addendum)
Radiologically dates back to 2007. H/o asbestos exposure Slight drop in DLCO from 68 to 61% 9/12 - lung volumes preserved Appears to be symptomatically worse now  DELSYM 2 tsp thrice daily Take ZYRTEC -D daily x 4 weeks - does not want meds that will make him drowsy Take benzonatate 200 mg thrice daily x 45 x 2 refills, try to avoid codeine here since he drives a good bit Ambulatory satn PFTs before next appt

## 2012-05-13 NOTE — Patient Instructions (Signed)
DELSYM 2 tsp thrice daily Take ZYRTEC -D daily x 4 weeks Take benzonatate 200 mg thrice daily x 45 x 2 refills Ambulatory satn Breathing test before next appt

## 2012-06-21 ENCOUNTER — Ambulatory Visit (INDEPENDENT_AMBULATORY_CARE_PROVIDER_SITE_OTHER): Payer: Medicare Other | Admitting: Pulmonary Disease

## 2012-06-21 ENCOUNTER — Encounter: Payer: Self-pay | Admitting: Pulmonary Disease

## 2012-06-21 VITALS — BP 128/70 | HR 83 | Temp 97.6°F | Ht 68.0 in | Wt 209.0 lb

## 2012-06-21 DIAGNOSIS — J8410 Pulmonary fibrosis, unspecified: Secondary | ICD-10-CM

## 2012-06-21 LAB — PULMONARY FUNCTION TEST

## 2012-06-21 NOTE — Progress Notes (Signed)
PFT done today. 

## 2012-06-21 NOTE — Progress Notes (Signed)
  Subjective:    Patient ID: [name], male    DOB: 1946/03/21, 66 y.o.   MRN: [id number]  HPI  66/M, ex smoker for FU of ILD.  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 There did not seem to be any bibasilar predominance. Mediastinal lymph nodes were increased in number and measured up to 1.1 cm in the AP window. Of note, a chest x-ray dating back to November 2007 showed some foci of fibrotic changes bilaterally.  The enlarged mediastinal lymph nodes and scattered areas of scarring could be consistent with asbestos exposure. There is no evidence of pleural calcification or thickening. Pulmonary function tests showed no evidence of airway obstruction. His lung volumes were preserved. Diffusion capacity was moderately decreased to 68%, but corrects for alveolar volume. This was consistent with interstitial lung disease. The honeycombing suggested chronic nature, and radiographically the changes date back at least to November2007.  CT chest ([facility]) on 07/26/09 showed diffuse ILD (upper & lower lobes) & again NO calcified pleural plaques>> Reviewed CT with radiologist, fibrosis appears worse.  PPD neg.  Desaturated to 91% on exertion , Wife reports loud snoring. ESR 49, RA factor pos, ANA neg  Apnealink >> mild obstructive sleep apnea with AHi 5/h    06/21/2012 Pt reports he gets "short winded" with mild activity such as playing with his dog  Desatn to 90% on walking 3 laps, not keen on pulm rehab, hoping to retire this year  Main complaint was cough ,Humidity and dust  make cough worse.    DELSYM 2 tsp thrice daily  Take ZYRTEC -D daily x 4 weeks - does not want meds that will make him drowsy  Take benzonatate 200 mg thrice daily x 45 x 2 refills >> Much improved with this regimen PFTs -mostly unchanged, preserved lung volumes,  slight drop in DLCO  to 63% from 68%.5 y ago, improved from 61% in 9/12     Review of Systems neg for any significant sore throat, dysphagia, itching, sneezing, nasal congestion or excess/ purulent secretions, fever, chills, sweats, unintended wt loss, pleuritic or exertional cp, hempoptysis, orthopnea pnd or change in chronic leg swelling. Also denies presyncope, palpitations, heartburn, abdominal pain, nausea, vomiting, diarrhea or change in bowel or urinary habits, dysuria,hematuria, rash, arthralgias, visual complaints, headache, numbness weakness or ataxia.     Objective:   Physical Exam  Gen. Pleasant, well-nourished, in no distress ENT - no lesions, no post nasal drip Neck: No JVD, no thyromegaly, no carotid bruits Lungs: no use of accessory muscles, no dullness to percussion, fine bibasal rales, no rhonchi  Cardiovascular: Rhythm regular, heart sounds  normal, no murmurs or gallops, no peripheral edema Musculoskeletal: No deformities, no cyanosis or clubbing        Assessment & Plan:

## 2012-06-21 NOTE — Patient Instructions (Signed)
STop taking cough perles once done Take zyrtec-D during spring & fall Fibrosis & lung function is stable

## 2012-06-22 NOTE — Assessment & Plan Note (Signed)
Radiologically dates back to 2007. H/o asbestos exposure Slight drop in DLCO from 68 to 61% 9/12 - lung volumes preserved  Mostly preserved PFTs Will obtain yearly PFTs to follow COugh much improved -indicatees related to upper airway rather than fibrosis

## 2012-07-02 ENCOUNTER — Encounter: Payer: Self-pay | Admitting: Pulmonary Disease

## 2012-10-13 DIAGNOSIS — R079 Chest pain, unspecified: Secondary | ICD-10-CM

## 2012-10-20 ENCOUNTER — Encounter: Payer: Self-pay | Admitting: Pulmonary Disease

## 2012-10-20 ENCOUNTER — Ambulatory Visit (INDEPENDENT_AMBULATORY_CARE_PROVIDER_SITE_OTHER): Payer: Medicare Other | Admitting: Pulmonary Disease

## 2012-10-20 ENCOUNTER — Other Ambulatory Visit (INDEPENDENT_AMBULATORY_CARE_PROVIDER_SITE_OTHER): Payer: Medicare Other

## 2012-10-20 ENCOUNTER — Ambulatory Visit (INDEPENDENT_AMBULATORY_CARE_PROVIDER_SITE_OTHER)
Admission: RE | Admit: 2012-10-20 | Discharge: 2012-10-20 | Disposition: A | Discharge status: Home / Self Care | Payer: Medicare Other | Source: Ambulatory Visit | Attending: Pulmonary Disease | Admitting: Pulmonary Disease

## 2012-10-20 VITALS — BP 110/72 | HR 87 | Temp 96.2°F | Ht 72.0 in | Wt 210.2 lb

## 2012-10-20 DIAGNOSIS — J8410 Pulmonary fibrosis, unspecified: Secondary | ICD-10-CM

## 2012-10-20 LAB — CBC WITH DIFFERENTIAL/PLATELET
Basophils Absolute: 0.1 10*3/uL (ref 0.0–0.1)
Eosinophils Relative: 3.5 % (ref 0.0–5.0)
HCT: 36.8 % — ABNORMAL LOW (ref 39.0–52.0)
Hemoglobin: 12.5 g/dL — ABNORMAL LOW (ref 13.0–17.0)
Lymphocytes Relative: 29.4 % (ref 12.0–46.0)
Monocytes Relative: 8 % (ref 3.0–12.0)
Platelets: 246 10*3/uL (ref 150.0–400.0)
RDW: 13.9 % (ref 11.5–14.6)
WBC: 8.2 10*3/uL (ref 4.5–10.5)

## 2012-10-20 NOTE — Progress Notes (Signed)
Chief Complaint  Patient presents with  . acute visit    RA pt. C/o increase SOB x 2-3 weeks. C/o cough w/ very little slight white-yellow phlem.  only wheezing anc chest tx when he coughs a lot. using cough drops for his cough.     History of Present Illness: [name] is a 67 y.o. male with pulmonary fibrosis.  He is followed by Dr. Vassie Loll.  He reports history of asbestos exposure.  He was doing reasonably well until 3 weeks ago.  Since then he has progressive dyspnea with any exertion.  He also has a cough which is usually dry, but does bring up clear sputum in the morning.  He denies wheeze, and no history of asthma.  He does get seasonal allergies.  He denies fever, chest pain, palpitations, joint pain/swelling, leg swelling, skin rash, or gland swelling.  He is not aware of any event that could have triggered his symptoms.  [name]  has a past medical history of Asbestos exposure and DOE (dyspnea on exertion).  [name]  has no past surgical history on file.  Prior to Admission medications   Medication Sig Start Date End Date Taking? Authorizing Provider  benzonatate (TESSALON) 200 MG capsule Take 1 capsule (200 mg total) by mouth 3 (three) times daily as needed for cough. 05/13/12  Yes Oretha Milch, MD  cetirizine-pseudoephedrine (ZYRTEC-D) 5-120 MG per tablet Take 1 tablet by mouth daily.   Yes Historical Provider, MD  cholecalciferol (VITAMIN D) 1000 UNITS tablet Take 2,000 Units by mouth daily.   Yes Historical Provider, MD    No Known Allergies   Physical Exam:  General - No distress ENT - No sinus tenderness, no oral exudate, no LAN Cardiac - s1s2 regular, no murmur Chest - No wheeze/rales/dullness Back - No focal tenderness Abd - Soft, non-tender Ext - No edema Neuro - Normal strength Skin - No rashes Psych - normal mood, and behavior  Labs 2011 >> RF 20.7, ANA negative, ESR 49  Dg Chest 2 View  10/20/2012  *RADIOLOGY REPORT*   Clinical Data: Cough.  Shortness of breath and pulmonary fibrosis  CHEST - 2 VIEW  Comparison: 04/09/2012  Findings: Normal heart size.  No pleural effusion or edema. Peripheral predominant interstitial reticulation and subpleural fibrosis is identified consistent with pulmonary fibrosis.  Slight increase in asymmetric opacity within the periphery of the right lung base.  This may reflect progression of disease or superimposed airspace consolidation.  IMPRESSION:  1.  Advanced pulmonary fibrosis. 2.  Decreased aeration to the peripheral aspect of the right lung base.   Original Report Authenticated By: Signa Kell, M.D.    PFT 06/21/12           >> FEV1 3.22 (104%), FVC 3.92 (86%), FEV1% 82, TLC 5.32 (78%), DLCO 63% Spirometry 10/20/12 >> FEV1 2.72 (73%), FVC 3.35 (69%), FEV1% 81   Assessment/Plan:  Coralyn Helling, MD [facility]Critical [facility]/Sleep Pager:  [phone] 10/20/2012, 3:26 PM

## 2012-10-20 NOTE — Patient Instructions (Signed)
Lab tests today Will schedule CT chest Follow up with Dr. Vassie Loll or Tammy Parrett in 1 week

## 2012-10-21 LAB — COMPREHENSIVE METABOLIC PANEL
ALT: 19 U/L (ref 0–53)
Albumin: 3.6 g/dL (ref 3.5–5.2)
CO2: 27 mEq/L (ref 19–32)
Calcium: 8.5 mg/dL (ref 8.4–10.5)
Chloride: 103 mEq/L (ref 96–112)
GFR: 67.77 mL/min (ref >60.00)
Glucose, Bld: 97 mg/dL (ref 70–99)
Sodium: 136 mEq/L (ref 135–145)
Total Protein: 7 g/dL (ref 6.0–8.3)

## 2012-10-21 LAB — ANA W/REFLEX IF POSITIVE: Anti Nuclear Antibody(ANA): NEGATIVE

## 2012-10-21 NOTE — Assessment & Plan Note (Signed)
He has subacute onset of progressive dyspnea.  His spirometry and CXR show some progression of disease.  To further assess will arrange for HRCT chest, CBC, CMET, ANA, RF, and anti-CCP.

## 2012-10-24 ENCOUNTER — Telehealth: Payer: Self-pay | Admitting: Pulmonary Disease

## 2012-10-24 NOTE — Telephone Encounter (Signed)
CBC    Component Value Date/Time   WBC 8.2 10/20/2012 1724   RBC 3.79* 10/20/2012 1724   HGB 12.5* 10/20/2012 1724   HCT 36.8* 10/20/2012 1724   PLT 246.0 10/20/2012 1724   MCV 96.9 10/20/2012 1724   MCHC 34.0 10/20/2012 1724   RDW 13.9 10/20/2012 1724   LYMPHSABS 2.4 10/20/2012 1724   MONOABS 0.7 10/20/2012 1724   EOSABS 0.3 10/20/2012 1724   BASOSABS 0.1 10/20/2012 1724     CMP     Component Value Date/Time   NA 136 10/20/2012 1724   K 4.1 10/20/2012 1724   CL 103 10/20/2012 1724   CO2 27 10/20/2012 1724   GLUCOSE 97 10/20/2012 1724   BUN 19 10/20/2012 1724   CREATININE 1.1 10/20/2012 1724   CALCIUM 8.5 10/20/2012 1724   PROT 7.0 10/20/2012 1724   ALBUMIN 3.6 10/20/2012 1724   AST 26 10/20/2012 1724   ALT 19 10/20/2012 1724   ALKPHOS 74 10/20/2012 1724   BILITOT 0.4 10/20/2012 1724    Lab Results  Component Value Date   ANA Negative 10/20/2012   RF <10 10/20/2012    He has f/u with Tammy Parrett scheduled for 10/26/12.  CT chest is scheduled for 11/11/12.  Will have my nurse inform pt that lab results were normal.  Will route results to Tammy Parrett and Dr. Vassie Loll for review.

## 2012-10-25 NOTE — Telephone Encounter (Signed)
Will see on 10/26/12 to assess

## 2012-10-26 ENCOUNTER — Ambulatory Visit (INDEPENDENT_AMBULATORY_CARE_PROVIDER_SITE_OTHER): Payer: Medicare Other | Admitting: Adult Health

## 2012-10-26 ENCOUNTER — Encounter: Payer: Self-pay | Admitting: Adult Health

## 2012-10-26 VITALS — BP 100/64 | HR 63 | Temp 97.0°F | Ht 70.0 in | Wt 209.8 lb

## 2012-10-26 DIAGNOSIS — J8410 Pulmonary fibrosis, unspecified: Secondary | ICD-10-CM

## 2012-10-26 MED ORDER — BENZONATATE 200 MG PO CAPS: 200.0000 mg | ORAL_CAPSULE | Freq: Three times a day (TID) | ORAL | Status: DC | PRN

## 2012-10-26 NOTE — Progress Notes (Signed)
Subjective:    Patient ID: [name], male    DOB: Dec 08, 1945, 67 y.o.   MRN: [id number]  HPI   66/M, ex smoker for FU of ILD.  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 There did not seem to be any bibasilar predominance. Mediastinal lymph nodes were increased in number and measured up to 1.1 cm in the AP window. Of note, a chest x-ray dating back to November 2007 showed some foci of fibrotic changes bilaterally.  The enlarged mediastinal lymph nodes and scattered areas of scarring could be consistent with asbestos exposure. There is no evidence of pleural calcification or thickening. Pulmonary function tests showed no evidence of airway obstruction. His lung volumes were preserved. Diffusion capacity was moderately decreased to 68%, but corrects for alveolar volume. This was consistent with interstitial lung disease. The honeycombing suggested chronic nature, and radiographically the changes date back at least to November2007.  CT chest ([facility]) on 07/26/09 showed diffuse ILD (upper & lower lobes) & again NO calcified pleural plaques>> Reviewed CT with radiologist, fibrosis appears worse.  PPD neg.  Desaturated to 91% on exertion , Wife reports loud snoring. ESR 49, RA factor pos, ANA neg  Apnealink >> mild obstructive sleep apnea with AHi 5/h    06/21/12 Pt reports he gets "short winded" with mild activity such as playing with his dog  Desatn to 90% on walking 3 laps, not keen on pulm rehab, hoping to retire this year  Main complaint was cough ,Humidity and dust  make cough worse.    DELSYM 2 tsp thrice daily  Take ZYRTEC -D daily x 4 weeks - does not want meds that will make him drowsy  Take benzonatate 200 mg thrice daily x 45 x 2 refills >> Much improved with this regimen PFTs -mostly unchanged, preserved lung volumes,  slight drop in DLCO  to 63% from 68%.5 y ago, improved from 61% in 9/12 >no changes   10/26/2012 Follow up  Pt returns for 1 week follow up for pulmonary fibrosis.  Seen last week for worsening DOE x several months.  Noted that he get more dyspneic on incline, steps and carrying heavy material for any distance.  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  Last ov desats to 90 % on Room air.  CXR and Spirometry showed progression.  Labs revealed Neg ANA and RA , CCP ab, IgG.  He has been set up for CT in 2 weeks.  We discussed his dz in detail.  No hemotpysis , chest pain, orthopnea, or edema. Good appetite. No n/v .  Recent stress test per pt was neg.  Dry cough is main complaint . Request rx for tessalon.   PFT 06/21/12           >> FEV1 3.22 (104%), FVC 3.92 (86%), FEV1% 82, TLC 5.32 (78%), DLCO 63% Spirometry 10/20/12 >> FEV1 2.72 (73%), FVC 3.35 (69%), FEV1% 81     Review of Systems  neg for any significant sore throat, dysphagia, itching, sneezing, nasal congestion or excess/ purulent secretions, fever, chills, sweats, unintended wt loss, pleuritic or exertional cp, hempoptysis, orthopnea pnd or change in chronic leg swelling. Also denies presyncope, palpitations, heartburn, abdominal pain, nausea, vomiting, diarrhea or change in bowel or urinary habits, dysuria,hematuria, rash, arthralgias, visual complaints, headache, numbness weakness or ataxia.     Objective:  Physical Exam   Gen. Pleasant, well-nourished, in no distress ENT - no lesions, no post nasal drip Neck: No JVD, no thyromegaly, no carotid bruits Lungs: no use of accessory muscles, no dullness to percussion, fine bibasal rales, no rhonchi  Cardiovascular: Rhythm regular, heart sounds  normal, no murmurs or gallops, no peripheral edema Musculoskeletal: No deformities, no cyanosis or clubbing        Assessment & Plan:

## 2012-10-26 NOTE — Patient Instructions (Addendum)
Delsym 2 tsp Twice daily  As needed  Cough  Zyrtec D As needed  Drainage  Tessalon Three times a day  As needed  Cough   follow up for CT as planned in 2 weeks .  Follow up Dr. Vassie Loll  In 6 weeks and As needed

## 2012-10-26 NOTE — Assessment & Plan Note (Signed)
Progressive changes on Xray and spirometry.  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  CT chest is pending.  Labs unrevealing.   Plan  Delsym 2 tsp Twice daily  As needed  Cough  Zyrtec D As needed  Drainage  Tessalon Three times a day  As needed  Cough   follow up for CT as planned in 2 weeks .  Follow up Dr. Vassie Loll  In 6 weeks and As needed

## 2012-11-11 ENCOUNTER — Ambulatory Visit (INDEPENDENT_AMBULATORY_CARE_PROVIDER_SITE_OTHER)
Admission: RE | Admit: 2012-11-11 | Discharge: 2012-11-11 | Disposition: A | Discharge status: Home / Self Care | Payer: Medicare Other | Source: Ambulatory Visit | Attending: Pulmonary Disease | Admitting: Pulmonary Disease

## 2012-11-11 DIAGNOSIS — J8410 Pulmonary fibrosis, unspecified: Secondary | ICD-10-CM

## 2012-11-11 MED ORDER — IOHEXOL 300 MG/ML  SOLN
80.0000 mL | Freq: Once | INTRAMUSCULAR | Status: AC | PRN
  Administered 2012-11-11: 80 mL via INTRAVENOUS

## 2012-11-15 ENCOUNTER — Encounter: Payer: Self-pay | Admitting: Pulmonary Disease

## 2012-12-13 ENCOUNTER — Encounter: Payer: Self-pay | Admitting: Pulmonary Disease

## 2012-12-13 ENCOUNTER — Ambulatory Visit (INDEPENDENT_AMBULATORY_CARE_PROVIDER_SITE_OTHER): Payer: Medicare Other | Admitting: Pulmonary Disease

## 2012-12-13 VITALS — BP 138/62 | HR 70 | Temp 98.7°F | Ht 70.0 in | Wt 211.4 lb

## 2012-12-13 DIAGNOSIS — J8410 Pulmonary fibrosis, unspecified: Secondary | ICD-10-CM

## 2012-12-13 NOTE — Progress Notes (Signed)
Subjective:    Patient ID: [name], male    DOB: 11-04-1945, 67 y.o.   MRN: [id number]  HPI 66/M, ex smoker for FU of ILD.  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 There did not seem to be any bibasilar predominance. Mediastinal lymph nodes were increased in number and measured up to 1.1 cm in the AP window. Of note, a chest x-ray dating back to November 2007 showed some foci of fibrotic changes bilaterally.  The enlarged mediastinal lymph nodes and scattered areas of scarring could be consistent with asbestos exposure. There is no evidence of pleural calcification or thickening. Pulmonary function tests showed no evidence of airway obstruction. His lung volumes were preserved. Diffusion capacity was moderately decreased to 68%, but corrects for alveolar volume. This was consistent with interstitial lung disease. The honeycombing suggested chronic nature, and radiographically the changes date back at least to November2007.  CT chest ([facility]) on 07/26/09 showed diffuse ILD (upper & lower lobes) & again NO calcified pleural plaques>> Reviewed CT with radiologist, fibrosis appears worse.  PPD neg.  Desaturated to 91% on exertion , Wife reports loud snoring. ESR 49, RA factor pos, ANA neg  Apnealink >> mild obstructive sleep apnea with [facility] 5/h    Nov 2013 PFTs -mostly unchanged, preserved lung volumes, slight drop in DLCO  to 63% from 68%.5 y ago, improved from 61% in 9/12    12/13/2012 C/o  worsening DOE x several months.  Noted that he get more dyspneic on incline, steps and carrying heavy material for any distance.  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  CXR and Spirometry showed progression.  Labs revealed Neg ANA and RA , CCP ab, IgG.  Recent stress test per pt was neg.  Dry cough is main complaint .  This is stable on tessalon.  . Pt states he is still coughing up light yellow phlem  but not as much. He usually coughs up phlem in the AM. no wheezing, no chest tx Desatn to 90% on walking 3 laps, not keen on pulm rehab  PFT 06/21/12           >> FEV1 3.22 (104%), FVC 3.92 (86%), FEV1% 82, TLC 5.32 (78%), DLCO 63% Spirometry 10/20/12 >> FEV1 2.72 (73%), FVC 3.35 (69%), FEV1% 81    Past Medical History  Diagnosis Date  . Asbestos exposure   . DOE (dyspnea on exertion)        Review of Systems neg for any significant sore throat, dysphagia, itching, sneezing, nasal congestion or excess/ purulent secretions, fever, chills, sweats, unintended wt loss, pleuritic or exertional cp, hempoptysis, orthopnea pnd or change in chronic leg swelling. Also denies presyncope, palpitations, heartburn, abdominal pain, nausea, vomiting, diarrhea or change in bowel or urinary habits, dysuria,hematuria, rash, arthralgias, visual complaints, headache, numbness weakness or ataxia.     Objective:   Physical Exam  Gen. Pleasant, well-nourished, in no distress, normal affect ENT - no lesions, no post nasal drip Neck: No JVD, no thyromegaly, no carotid bruits Lungs: no use of accessory muscles, no dullness to percussion, bibasal  Rales, no rhonchi  Cardiovascular: Rhythm regular, heart sounds  normal, no murmurs or gallops, no peripheral edema Abdomen: soft and non-tender, no hepatosplenomegaly, BS normal. Musculoskeletal: No deformities, no cyanosis or clubbing Neuro:  alert, non focal  Assessment & Plan:

## 2012-12-13 NOTE — Patient Instructions (Addendum)
Your fibrosis is getting worse - lung function dropped 10 points. We discussed side effects of new drug - photosensitivity & diarrhea Proceed with bronchoscopy next Tuesday 12/21/12 - 0730  We can consider trial of  small dose prednisone in the future

## 2012-12-14 NOTE — Assessment & Plan Note (Signed)
Although radiology is suggestive of UIP, mild mediastinal lymphadenopathy is atypical. 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 I discussed compassionate use of perfenidone. He is not very excited about photosensitivity as a side effect With recent drop in lung function, I discussed biopsy - he wishes to avoid surgical biopsy & I am in agreement since his featurees are mostly typical for IPF. Will proceed with transbronchial biopsy next week - The various options of biopsy including bronchoscopy and surgical biopsy were discussed.The risks of each procedure including coughing, bleeding and the  chances of lung puncture requiring chest tube were discussed in great detail. The benefits & alternatives including serial follow up were also discussed. I will include him in perfenidone trial  He will continue with cough suppression measures - if cough increases, consider low dose prednisone

## 2012-12-21 ENCOUNTER — Encounter (HOSPITAL_COMMUNITY): Payer: Self-pay | Admitting: Pulmonary Disease

## 2012-12-21 ENCOUNTER — Encounter (HOSPITAL_COMMUNITY)
Admission: RE | Disposition: A | Discharge status: Home / Self Care | Payer: Self-pay | Source: Ambulatory Visit | Attending: Pulmonary Disease

## 2012-12-21 ENCOUNTER — Ambulatory Visit (HOSPITAL_COMMUNITY)
Admission: RE | Admit: 2012-12-21 | Discharge: 2012-12-21 | Disposition: A | Discharge status: Home / Self Care | Payer: Medicare Other | Source: Ambulatory Visit | Attending: Pulmonary Disease | Admitting: Pulmonary Disease

## 2012-12-21 ENCOUNTER — Ambulatory Visit (HOSPITAL_COMMUNITY): Payer: Medicare Other

## 2012-12-21 DIAGNOSIS — Z87891 Personal history of nicotine dependence: Secondary | ICD-10-CM | POA: Insufficient documentation

## 2012-12-21 DIAGNOSIS — Z77090 Contact with and (suspected) exposure to asbestos: Secondary | ICD-10-CM | POA: Insufficient documentation

## 2012-12-21 DIAGNOSIS — R0609 Other forms of dyspnea: Secondary | ICD-10-CM | POA: Insufficient documentation

## 2012-12-21 DIAGNOSIS — R0989 Other specified symptoms and signs involving the circulatory and respiratory systems: Secondary | ICD-10-CM | POA: Insufficient documentation

## 2012-12-21 DIAGNOSIS — R918 Other nonspecific abnormal finding of lung field: Secondary | ICD-10-CM | POA: Insufficient documentation

## 2012-12-21 DIAGNOSIS — J8410 Pulmonary fibrosis, unspecified: Secondary | ICD-10-CM

## 2012-12-21 HISTORY — PX: VIDEO BRONCHOSCOPY: SHX5072

## 2012-12-21 SURGERY — BRONCHOSCOPY, WITH FLUOROSCOPY
Anesthesia: Moderate Sedation | Laterality: Bilateral

## 2012-12-21 MED ORDER — MIDAZOLAM HCL 10 MG/2ML IJ SOLN
INTRAMUSCULAR | Status: AC
  Filled 2012-12-21: qty 4

## 2012-12-21 MED ORDER — PHENYLEPHRINE HCL 0.25 % NA SOLN
NASAL | Status: DC | PRN
  Administered 2012-12-21: 1 via NASAL

## 2012-12-21 MED ORDER — SODIUM CHLORIDE 0.9 % IV SOLN
INTRAVENOUS | Status: DC
  Administered 2012-12-21: 08:00:00 via INTRAVENOUS

## 2012-12-21 MED ORDER — LIDOCAINE HCL 1 % IJ SOLN
INTRAMUSCULAR | Status: DC | PRN
  Administered 2012-12-21: 6 mL via RESPIRATORY_TRACT

## 2012-12-21 MED ORDER — MIDAZOLAM HCL 10 MG/2ML IJ SOLN
INTRAMUSCULAR | Status: DC | PRN
  Administered 2012-12-21 (×3): 1 mg via INTRAVENOUS

## 2012-12-21 MED ORDER — LIDOCAINE HCL 2 % EX GEL
CUTANEOUS | Status: DC | PRN
  Administered 2012-12-21: 1

## 2012-12-21 MED ORDER — FENTANYL CITRATE 0.05 MG/ML IJ SOLN
INTRAMUSCULAR | Status: DC | PRN
  Administered 2012-12-21 (×3): 25 ug via INTRAVENOUS

## 2012-12-21 MED ORDER — FENTANYL CITRATE 0.05 MG/ML IJ SOLN
INTRAMUSCULAR | Status: AC
  Filled 2012-12-21: qty 4

## 2012-12-21 NOTE — Interval H&P Note (Signed)
History and Physical Interval Note:  12/21/2012 7:47 AM  [name]  has presented today for surgery, with the diagnosis of Pulmonary Fibrosis  The various methods of treatment have been discussed with the patient and family. After consideration of risks, benefits and other options for treatment, the patient has consented to  Procedure(s): VIDEO BRONCHOSCOPY WITH FLUORO (Bilateral) transbronchial biopsy as a surgical intervention .  The patient's history has been reviewed, patient examined, no change in status, stable for surgery.  I have reviewed the patient's chart and labs.  Questions were answered to the patient's satisfaction.     Ewing Fandino V.

## 2012-12-21 NOTE — Op Note (Signed)
Indication: Bilateral LL pulmonary infiltrates in the 67 -year-old in a pattern of pulmonary fibrosis s/o UIP  Written informed consent was obtained from the patient prior to the procedure. The risks of the procedure including coughing, bleeding and a small chance of lung cancer requiring a chest tube were discussed with the patient in great detail and evidenced understanding.  3 mg of Versed and  of fentanyl were used in divided doses during the procedure. Bronchoscope was inserted from the right Nare. The upper airway appeared normal. Vocal cord showed normal appearance in motion. The trachea bronchial tree was then examined to the subsegmental level. No secretions were noted. No endobronchial lesions were noted.  Attention was then turned to the right lower lobe. Bronchoalveolar lavage was obtained from the right lower lobe with good return. Transbronchial biopsies x4 were obtained from the different subsegments of the right lower lobe. The patient tolerated procedure well with minimal bleeding.  A portable chest XR. will be performed to rule out presence of pneumothorax. He was awake and alert in the end of the procedure.  Chenille Toor V.

## 2012-12-21 NOTE — Progress Notes (Signed)
Video Bronchoscopy Done  Intervention Bronchial washing done Intervention Bronchial biopsy done  Procedure tolerated well

## 2012-12-21 NOTE — H&P (View-Only) (Signed)
Subjective:    Patient ID: [name], male    DOB: 06/28/1946, 66 y.o.   MRN: [id number]  HPI 66/M, ex smoker for FU of ILD.  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 There did not seem to be any bibasilar predominance. Mediastinal lymph nodes were increased in number and measured up to 1.1 cm in the AP window. Of note, a chest x-ray dating back to November 2007 showed some foci of fibrotic changes bilaterally.  The enlarged mediastinal lymph nodes and scattered areas of scarring could be consistent with asbestos exposure. There is no evidence of pleural calcification or thickening. Pulmonary function tests showed no evidence of airway obstruction. His lung volumes were preserved. Diffusion capacity was moderately decreased to 68%, but corrects for alveolar volume. This was consistent with interstitial lung disease. The honeycombing suggested chronic nature, and radiographically the changes date back at least to November2007.  CT chest ([facility]) on 07/26/09 showed diffuse ILD (upper & lower lobes) & again NO calcified pleural plaques>> Reviewed CT with radiologist, fibrosis appears worse.  PPD neg.  Desaturated to 91% on exertion , Wife reports loud snoring. ESR 49, RA factor pos, ANA neg  Apnealink >> mild obstructive sleep apnea with AHi 5/h    Nov 2013 PFTs -mostly unchanged, preserved lung volumes, slight drop in DLCO  to 63% from 68%.5 y ago, improved from 61% in 9/12    12/13/2012 C/o  worsening DOE x several months.  Noted that he get more dyspneic on incline, steps and carrying heavy material for any distance.  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  CXR and Spirometry showed progression.  Labs revealed Neg ANA and RA , CCP ab, IgG.  Recent stress test per pt was neg.  Dry cough is main complaint .  This is stable on tessalon.  . Pt states he is still coughing up light yellow phlem  but not as much. He usually coughs up phlem in the AM. no wheezing, no chest tx Desatn to 90% on walking 3 laps, not keen on pulm rehab  PFT 06/21/12           >> FEV1 3.22 (104%), FVC 3.92 (86%), FEV1% 82, TLC 5.32 (78%), DLCO 63% Spirometry 10/20/12 >> FEV1 2.72 (73%), FVC 3.35 (69%), FEV1% 81    Past Medical History  Diagnosis Date  . Asbestos exposure   . DOE (dyspnea on exertion)        Review of Systems neg for any significant sore throat, dysphagia, itching, sneezing, nasal congestion or excess/ purulent secretions, fever, chills, sweats, unintended wt loss, pleuritic or exertional cp, hempoptysis, orthopnea pnd or change in chronic leg swelling. Also denies presyncope, palpitations, heartburn, abdominal pain, nausea, vomiting, diarrhea or change in bowel or urinary habits, dysuria,hematuria, rash, arthralgias, visual complaints, headache, numbness weakness or ataxia.     Objective:   Physical Exam  Gen. Pleasant, well-nourished, in no distress, normal affect ENT - no lesions, no post nasal drip Neck: No JVD, no thyromegaly, no carotid bruits Lungs: no use of accessory muscles, no dullness to percussion, bibasal  Rales, no rhonchi  Cardiovascular: Rhythm regular, heart sounds  normal, no murmurs or gallops, no peripheral edema Abdomen: soft and non-tender, no hepatosplenomegaly, BS normal. Musculoskeletal: No deformities, no cyanosis or clubbing Neuro:  alert, non focal         Assessment & Plan:   

## 2012-12-21 NOTE — Progress Notes (Signed)
This note also relates to the following rows which could not be included: Temp - Cannot attach notes to unvalidated device data    12/21/12 0755  Vitals  Pulse Rate 62  ECG Heart Rate 63  Resp ! 21  BP 127/79 mmHg  SpO2 100 %  Oxygen Therapy  O2 Device Nasal cannula  O2 Flow Rate (L/min) 4 L/min  Pulse Oximetry Type Continuous  SpO2 Alarm Limit Low 92  LOC  Level of Consciousness Sedated  Orientation Level Disoriented X4   @ 0757 increased O2 to 6Lpm

## 2012-12-23 LAB — CULTURE, BAL-QUANTITATIVE W GRAM STAIN

## 2012-12-30 ENCOUNTER — Telehealth: Payer: Self-pay | Admitting: Pulmonary Disease

## 2012-12-30 NOTE — Telephone Encounter (Signed)
I spoke with pt. He has not heard from jeanette. I called jeanette and she stated she will be calling him today. i called pt back and made him aware. Nothing further was needed

## 2013-01-17 ENCOUNTER — Other Ambulatory Visit: Payer: Self-pay | Admitting: Adult Health

## 2013-01-18 LAB — FUNGUS CULTURE W SMEAR: Fungal Smear: NONE SEEN

## 2013-01-21 NOTE — Telephone Encounter (Signed)
Received electronic refill request for Benzonatate 200mg  Rx originally given at 10.17.13 acute visit w/ TP Last ov 5.19.14 w/ RA: Patient Instructions    Your fibrosis is getting worse - lung function dropped 10 points.  We discussed side effects of new drug - photosensitivity & diarrhea  Proceed with bronchoscopy next Tuesday 12/21/12 - 0730  We can consider trial of small dose prednisone in the future   Dr Vassie Loll, may pt have refills?  Thank you.

## 2013-01-23 NOTE — Telephone Encounter (Signed)
Ok  90 x 2 refill

## 2013-02-02 LAB — AFB CULTURE WITH SMEAR (NOT AT ARMC): Acid Fast Smear: NONE SEEN

## 2013-04-28 ENCOUNTER — Encounter: Payer: Self-pay | Admitting: Pulmonary Disease

## 2013-04-28 ENCOUNTER — Ambulatory Visit (INDEPENDENT_AMBULATORY_CARE_PROVIDER_SITE_OTHER): Payer: Medicare Other | Admitting: Pulmonary Disease

## 2013-04-28 VITALS — BP 122/78 | HR 97 | Temp 97.3°F | Ht 70.0 in | Wt 208.4 lb

## 2013-04-28 DIAGNOSIS — Z23 Encounter for immunization: Secondary | ICD-10-CM

## 2013-04-28 DIAGNOSIS — J8410 Pulmonary fibrosis, unspecified: Secondary | ICD-10-CM

## 2013-04-28 NOTE — Progress Notes (Signed)
Subjective:    Patient ID: [name], male    DOB: [birth year], 67 y.o.   MRN: [id number]  HPI  67/M, ex smoker for FU of ILD.  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 There did not seem to be any bibasilar predominance. Mediastinal lymph nodes were increased in number and measured up to 1.1 cm in the AP window. Of note, a chest x-ray dating back to November 2007 showed some foci of fibrotic changes bilaterally.  The enlarged mediastinal lymph nodes and scattered areas of scarring could be consistent with asbestos exposure. There is no evidence of pleural calcification or thickening. Pulmonary function tests showed no evidence of airway obstruction. His lung volumes were preserved. Diffusion capacity was moderately decreased to 68%, but corrects for alveolar volume. This was consistent with interstitial lung disease. The honeycombing suggested chronic nature, and radiographically the changes date back at least to November2007.  CT chest ([facility]) on 07/26/09 showed diffuse ILD (upper & lower lobes) & again NO calcified pleural plaques>> Reviewed CT with radiologist, fibrosis appears worse.  PPD neg.   ESR 49, RA factor pos, ANA neg  Apnealink >> mild obstructive sleep apnea with [facility] 5/h  Nov 2013 PFTs -mostly unchanged, preserved lung volumes, slight drop in DLCO to 63% from 68%.5 y ago, improved from 61% in 9/12     04/28/2013 70m FU  Pt reports his breathing has been okay. He reports if he does extreme activity then he will be SOB. He still has cough w/ very little mucus in the AM ocasionally. Denies any wheezing and no chest tx.   C/o worsening DOE x several months.  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  CXR and Spirometry showed progression.  Labs revealed Neg ANA and RA , CCP ab, IgG.  Recent  stress test per pt was neg.  Dry cough is main complaint .  Desatn to 88% on walking 3 laps, not keen on pulm rehab  PFT 06/21/12 >> FEV1 3.22 (104%), FVC 3.92 (86%), FEV1% 82, TLC 5.32 (78%), DLCO 63%  Spirometry 10/20/12 >> FEV1 2.72 (73%), FVC 3.35 (69%), FEV1% 81  TBBX 11/2012 non diagnostic  He is focused on this visit on ruling out asbestosis & trying to see whether he can get some compensation for this   Past Medical History  Diagnosis Date  . Asbestos exposure   . DOE (dyspnea on exertion)     Review of Systems neg for any significant sore throat, dysphagia, itching, sneezing, nasal congestion or excess/ purulent secretions, fever, chills, sweats, unintended wt loss, pleuritic or exertional cp, hempoptysis, orthopnea pnd or change in chronic leg swelling. Also denies presyncope, palpitations, heartburn, abdominal pain, nausea, vomiting, diarrhea or change in bowel or urinary habits, dysuria,hematuria, rash, arthralgias, visual complaints, headache, numbness weakness or ataxia.     Objective:   Physical Exam  Gen. Pleasant, well-nourished, in no distress, normal affect ENT - no lesions, no post nasal drip Neck: No JVD, no thyromegaly, no carotid bruits Lungs: no use of accessory muscles, no dullness to percussion, bibasal rales, no rhonchi  Cardiovascular: Rhythm regular, heart sounds  normal, no murmurs or gallops, no peripheral edema Abdomen: soft and non-tender, no hepatosplenomegaly, BS normal. Musculoskeletal: No deformities, no cyanosis or clubbing Neuro:  alert, non focal       Assessment & Plan:

## 2013-04-28 NOTE — Patient Instructions (Addendum)
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

## 2013-04-29 NOTE — Assessment & Plan Note (Signed)
Radiologically dates back to 2007 & typical IPF. 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 He will seek second opinion for this. If unable, will call us & we can send him for a referral to [facility] or [facility]. He is now open to trying perfenidone if he qualifies & we will screen. Serology neg Flu shot today If cough worsens, low dose prednisone can be tried

## 2013-06-07 IMAGING — CR DG CHEST 1V PORT
1 series · 1 of 1 positions shown · non-contrast
Comparison: 10/20/2012

CLINICAL DATA: Post bronchoscopy.

PORTABLE CHEST - 1 VIEW

[AP]
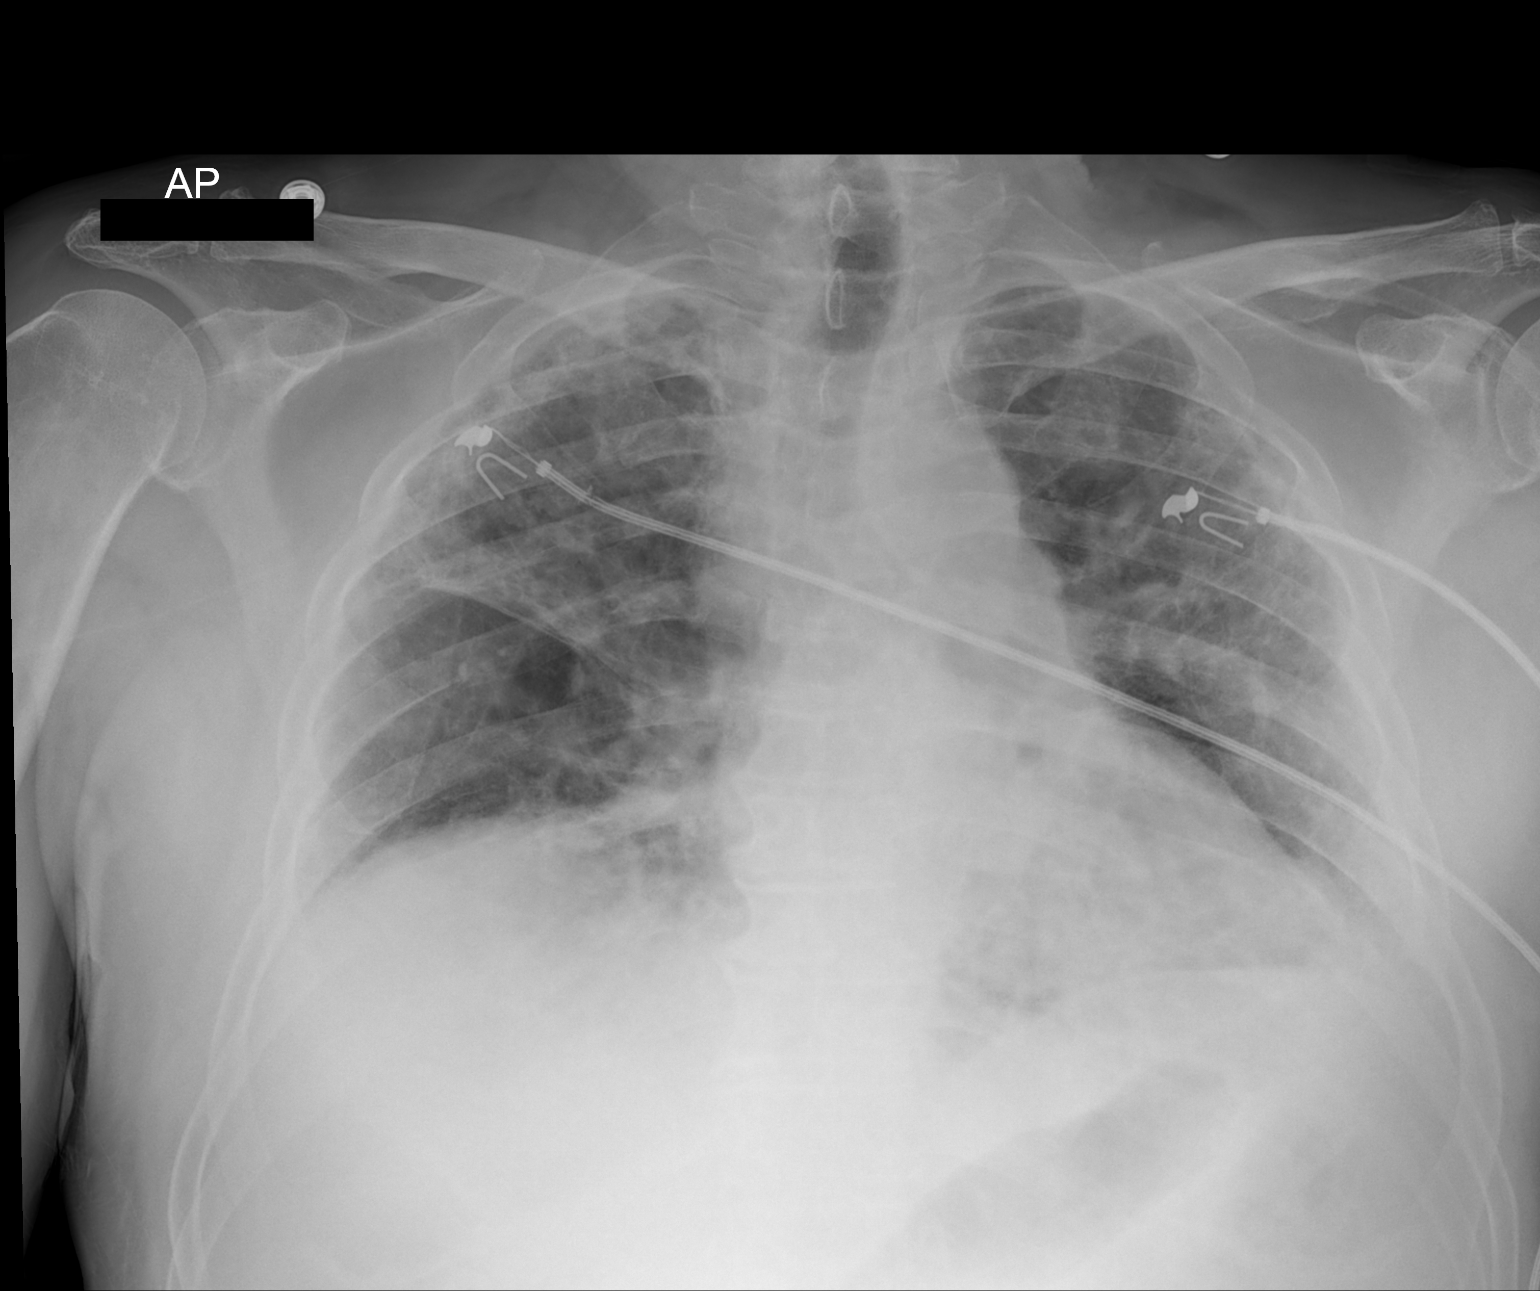

[1 of 1 positions shown; findings below may reference images not displayed]

FINDINGS: Low lung volumes.  Patchy airspace disease throughout the
lungs, similar to prior study.  No pneumothorax.  No pleural
effusion.  Heart is normal size.
IMPRESSION: Low lung volumes with patchy bilateral opacities, stable.  No
pneumothorax following bronchoscopy.

## 2013-10-31 ENCOUNTER — Encounter (HOSPITAL_COMMUNITY)
Admission: RE | Admit: 2013-10-31 | Discharge: 2013-10-31 | Disposition: A | Discharge status: Home / Self Care | Payer: Medicare Other | Source: Ambulatory Visit | Attending: Internal Medicine | Admitting: Internal Medicine

## 2013-10-31 DIAGNOSIS — Z5189 Encounter for other specified aftercare: Secondary | ICD-10-CM | POA: Insufficient documentation

## 2013-10-31 DIAGNOSIS — J8410 Pulmonary fibrosis, unspecified: Secondary | ICD-10-CM | POA: Diagnosis present

## 2013-10-31 NOTE — Progress Notes (Signed)
[name] 68 y.o. male Pulmonary Rehab Orientation Note Patient arrived today in Cardiac [facility] Pul[facility] Rehab for orientation to Pulmonary Rehab. He was transported from [employer] via wheel chair. He does carry portable oxygen. Per pt, he uses oxygen when he is walking long distances. Patient took his portable oxygen off when he arrived to pulmonary rehab, I weighed him and measured his height and after that his oxygen saturations decreased to 81% on room air.  This was a great teaching opportunity in that I explained the need for oxygen and how it is difficult to know when your oxygen level is low.  Purse lip breathing demonstrated.   Color good, skin warm and dry. Patient is oriented to time and place. Patient's medical history and medications reviewed. Heart rate is normal, breath sounds clear with crackles bilaterally 1/2 up in lung fields. Grip strength equal, strong. Distal pulses 2+ posterior tibial pulses present. Patient reports he does take medications as prescribed. Patient states he follows a Regular. The patient reports no specific efforts to gain or lose weight.  Patient's weight will be monitored closely. Demonstration and practice of PLB using pulse oximeter. Patient able to return demonstration satisfactorily. Safety and hand hygiene in the exercise area reviewed with patient. Patient voices understanding of the information reviewed. Department expectations discussed with patient and achievable goals were set. The patient shows enthusiasm about attending the program and we look forward to working with this nice gentleman. The patient is scheduled for a 6 min walk test on Monday, November 07, 2013 @ 4pm and to begin exercise on Tuesday, November 08, 2013 @ 1:30pm.   [id number] [place]

## 2013-11-07 ENCOUNTER — Encounter (HOSPITAL_COMMUNITY): Payer: Self-pay

## 2013-11-07 ENCOUNTER — Encounter (HOSPITAL_COMMUNITY)
Admission: RE | Admit: 2013-11-07 | Discharge: 2013-11-07 | Disposition: A | Discharge status: Home / Self Care | Payer: Medicare Other | Source: Ambulatory Visit | Attending: Internal Medicine | Admitting: Internal Medicine

## 2013-11-07 NOTE — Progress Notes (Signed)
[name] completed a Six-Minute Walk Test on 11/07/13 . [name] walked 941 feet with 0 breaks.  The patient's lowest oxygen saturation was 86 , highest heart rate was 123 , and highest blood pressure was 110/58. The patient was on 4-6 liters. [name] stated that nothing hindered their walk test.

## 2013-11-08 ENCOUNTER — Encounter (HOSPITAL_COMMUNITY)
Admission: RE | Admit: 2013-11-08 | Discharge: 2013-11-08 | Disposition: A | Discharge status: Home / Self Care | Payer: Medicare Other | Source: Ambulatory Visit | Attending: Internal Medicine | Admitting: Internal Medicine

## 2013-11-08 ENCOUNTER — Encounter (HOSPITAL_COMMUNITY): Payer: Self-pay | Admitting: *Deleted

## 2013-11-08 DIAGNOSIS — Z5189 Encounter for other specified aftercare: Secondary | ICD-10-CM | POA: Diagnosis not present

## 2013-11-08 NOTE — Progress Notes (Signed)
Today, [name] exercised at [facility]. Service time was from 1330 to 1530.  The patient exercised for more than 31 minutes performing aerobic, strengthening, and stretching exercises. Oxygen saturation, heart rate, blood pressure, rate of perceived exertion, and shortness of breath were all monitored before, during, and after exercise. [name] presented with no problems at today's exercise session.   There was no workload change during today's exercise session.  Pre-exercise vitals:   Weight kg: 90.1   Liters of O2: RA   SpO2: 96   HR: 98   BP: 110/60   CBG: NA  Exercise vitals:   Highest heartrate:  108   Lowest oxygen saturation: 94   Highest blood pressure: 100/60   Liters of 02: 2  Post-exercise vitals:   SpO2: 97   HR: 97   BP: 104/60   Liters of O2: 2   CBG: NA Dr. Brand Males, Medical Director Dr. Dyann Kief is immediately available during today's Pulmonary Rehab session for [name] on 11/08/2013 at 1:30 pm class time.

## 2013-11-10 ENCOUNTER — Encounter (HOSPITAL_COMMUNITY)
Admission: RE | Admit: 2013-11-10 | Discharge: 2013-11-10 | Disposition: A | Discharge status: Home / Self Care | Payer: Medicare Other | Source: Ambulatory Visit | Attending: Internal Medicine | Admitting: Internal Medicine

## 2013-11-10 ENCOUNTER — Encounter (HOSPITAL_COMMUNITY): Payer: Self-pay

## 2013-11-10 DIAGNOSIS — Z5189 Encounter for other specified aftercare: Secondary | ICD-10-CM | POA: Diagnosis not present

## 2013-11-10 NOTE — Progress Notes (Signed)
Today, [name] exercised at [facility]. Service time was from 12:30 to 3:30.  The patient exercised for more than 31 minutes performing aerobic, strengthening, and stretching exercises. Oxygen saturation, heart rate, blood pressure, rate of perceived exertion, and shortness of breath were all monitored before, during, and after exercise. [name] presented with no problems at today's exercise session.   There was no workload change during today's exercise session.  Pre-exercise vitals:   Weight kg: 89.6   Liters of O2: ra   SpO2: 95   HR: 100   BP: 110/68   CBG: na  Exercise vitals:   Highest heartrate:  122   Lowest oxygen saturation: 85% on 2 liters increased to 3 liters to get above 90%   Highest blood pressure: 98/56   Liters of 02: 3  Post-exercise vitals:   SpO2: 97   HR: 102   BP: 116/60   Liters of O2: 2   CBG: na  Dr. Brand Males, Medical Director Dr. Aileen Fass is immediately available during today's Pulmonary Rehab session for [name] on 11/10/13 at 1:30pm class time.

## 2013-11-15 ENCOUNTER — Encounter (HOSPITAL_COMMUNITY)
Admission: RE | Admit: 2013-11-15 | Discharge: 2013-11-15 | Disposition: A | Discharge status: Home / Self Care | Payer: Medicare Other | Source: Ambulatory Visit | Attending: Internal Medicine | Admitting: Internal Medicine

## 2013-11-15 DIAGNOSIS — Z5189 Encounter for other specified aftercare: Secondary | ICD-10-CM | POA: Diagnosis not present

## 2013-11-15 NOTE — Progress Notes (Signed)
Today, [name] exercised at [facility]. Service time was from 1330 to 1515.  The patient exercised for more than 31 minutes performing aerobic, strengthening, and stretching exercises. Oxygen saturation, heart rate, blood pressure, rate of perceived exertion, and shortness of breath were all monitored before, during, and after exercise. [name] presented with one problem at today's exercise session. We found a blood pressure difference.  His BP in left arm was 108/50 and the right arm was 180/84.  This information was given to Dr. Gar Ponto @ [facility] in [place], [place].  They will call the patient when Dr. Quillian Quince reviews this information with further instructions.  There was no workload change during today's exercise session.  Pre-exercise vitals:   Weight kg: 89.2   Liters of O2: 2   SpO2: 95   HR: 98   BP: 100/84   CBG: NA  Exercise vitals:   Highest heartrate:  115   Lowest oxygen saturation: 83 which came up to 92 with purse lip breathing.   Highest blood pressure: 180/84   Liters of 02: 3  Post-exercise vitals:   SpO2: 93   HR: 100   BP: 107/60   Liters of O2: 2   CBG: NA Dr. Brand Males, Medical Director Dr. Aileen Fass is immediately available during today's Pulmonary Rehab session for [name] on 11/15/2013 at 1:30pm class time.

## 2013-11-17 ENCOUNTER — Encounter (HOSPITAL_COMMUNITY)
Admission: RE | Admit: 2013-11-17 | Discharge: 2013-11-17 | Disposition: A | Discharge status: Home / Self Care | Payer: Medicare Other | Source: Ambulatory Visit | Attending: Internal Medicine | Admitting: Internal Medicine

## 2013-11-17 DIAGNOSIS — Z5189 Encounter for other specified aftercare: Secondary | ICD-10-CM | POA: Diagnosis not present

## 2013-11-17 NOTE — Progress Notes (Signed)
Today, [name] exercised at [facility]. Service time was from 1400 to 1530.  The patient exercised for more than 31 minutes performing aerobic, strengthening, and stretching exercises. Oxygen saturation, heart rate, blood pressure, rate of perceived exertion, and shortness of breath were all monitored before, during, and after exercise. [name] presented with no problems at today's exercise session. [name] attended the medication education lecture.  There was a workload change during today's exercise session.  Pre-exercise vitals:   Weight kg: 90.6   Liters of O2: 2 liters   SpO2: 97   HR: 89   BP: 102/50   CBG: na  Exercise vitals:   Highest heartrate:  110   Lowest oxygen saturation: 90   Highest blood pressure: 114/62   Liters of 02: 3 liters  Post-exercise vitals:   SpO2: 98   HR: 90   BP: 114/62   Liters of O2: 2 liters   CBG: na  Dr. Brand Males, Medical Director Dr. Sheran Fava is immediately available during today's Pulmonary Rehab session for [name] on 11/17/13 at 1330 class time.

## 2013-11-22 ENCOUNTER — Encounter (HOSPITAL_COMMUNITY)
Admission: RE | Admit: 2013-11-22 | Discharge: 2013-11-22 | Disposition: A | Discharge status: Home / Self Care | Payer: Medicare Other | Source: Ambulatory Visit | Attending: Internal Medicine | Admitting: Internal Medicine

## 2013-11-22 DIAGNOSIS — Z5189 Encounter for other specified aftercare: Secondary | ICD-10-CM | POA: Diagnosis not present

## 2013-11-22 NOTE — Progress Notes (Signed)
[name] 68 y.o. male Nutrition Note Spoke with pt. Pt is overweight. Pt eats 2 meals a day (breakfast and dinner) and sometimes snacks in between. Pt reports he has had a decreased appetite that have been going on since February of this year. Pt has some room for improvement, but pt was mostly educated on making sure he is getting enough calories/energy throughout the day. Pt reports he has been making himself eat and it is very difficult as he is also gets full quickly. Pt is unsure if he has lost weight recently, but wants to maintain his current weight. Pt reports he has been eating foods that are his favorite, such as Sugar Pops cereal, fried fish, and ice cream. Pt reports he usually makes himself a protein drink once a day using whey protein and milk to help with his calorie and protein intake. Pt was educated on choosing nutrient-dense foods to snack on or have small meals throughout the day to help prevent getting full fast while increasing his caloric intake. Pt was also educated to continue his protein drink once a day or between his meals.  Pt was encouraged to eat more fruit and vegetables to increase his daily vitamin and minerals intake. Pt's Rate Your Plate results reviewed with pt.  Pt expressed understanding.  Pt reports he does eat out frequently. Pt was educated on watching his sodium intake. The role of sodium in lung disease reviewed with pt. Pt expressed understanding.  Nutrition Diagnosis   Food-and nutrition-related knowledge deficit related to lack of exposure to information as related to diagnosis of pulmonary disease   Overweight related to excessive energy intake as evidenced by a BMI of 29    Nutrition Rx/Est. Daily Nutrition Needs for: ? wt maintenance 2200-2400 Kcal, 100-115 gm protein, 2000 mg or less sodium     Nutrition Intervention   Pt's individual nutrition plan and goals reviewed with pt.   Benefits of adopting healthy eating habits discussed when pt's  Rate Your Plate reviewed.   Pt to attend the Nutrition and Lung Disease class   Continual client-centered nutrition education by RD, as part of interdisciplinary care.  Goal(s) 1. Pt to identify and limit food sources of sodium. 2. The pt will consume high-energy, high-nutrient dense beverages when necessary to compensate for decreased oral intake of solid foods. 3. Describe the benefit of including fruits, vegetables, whole grains, and low-fat dairy products in a healthy meal plan.  Monitor and Evaluate progress toward nutrition goal with team.   Kallie Locks Dietetic Intern   11/22/2013 3:12 PM  Derek Mound, M.Ed, RD, LDN, CDE 11/23/2013 10:51 AM

## 2013-11-22 NOTE — Progress Notes (Signed)
Today, [name] exercised at [facility]. Service time was from 1330 to 1515.  The patient exercised for more than 31 minutes performing aerobic, strengthening, and stretching exercises. Oxygen saturation, heart rate, blood pressure, rate of perceived exertion, and shortness of breath were all monitored before, during, and after exercise. [name] required to be increased to 6 liters/minute during exercise today to keep his saturations 89-93%.  Purse lip breathing instruction done several times, which improved saturations.  There was no workload change during today's exercise session.  Pre-exercise vitals:   Weight kg: 90.7   Liters of O2: 2   SpO2: 95   HR: 96   BP: 102/50   CBG: NA  Exercise vitals:   Highest heartrate:  117   Lowest oxygen saturation: 83 which increased with purse lip breathing and oxygen increased to 6 L   Highest blood pressure: 104/60   Liters of 02: 6  Post-exercise vitals:   SpO2: 97   HR: 93   BP: 102/64   Liters of O2: 2   CBG: NA Dr. Brand Males, Medical Director Dr. Sheran Fava is immediately available during today's Pulmonary Rehab session for [name] on 11/22/2013 at 1330 class time.

## 2013-11-24 ENCOUNTER — Encounter (HOSPITAL_COMMUNITY)
Admission: RE | Admit: 2013-11-24 | Discharge: 2013-11-24 | Disposition: A | Discharge status: Home / Self Care | Payer: Medicare Other | Source: Ambulatory Visit | Attending: Internal Medicine | Admitting: Internal Medicine

## 2013-11-24 DIAGNOSIS — Z5189 Encounter for other specified aftercare: Secondary | ICD-10-CM | POA: Diagnosis not present

## 2013-11-24 NOTE — Progress Notes (Signed)
Today, [name] exercised at [facility]. Service time was from 1330 to 1530.  The patient exercised for more than 31 minutes performing aerobic, strengthening, and stretching exercises. Oxygen saturation, heart rate, blood pressure, rate of perceived exertion, and shortness of breath were all monitored before, during, and after exercise. [name] presented with no problems at today's exercise session. Patient attended the exercise lecture today.   There was a workload change during today's exercise session.  Pre-exercise vitals:   Weight kg: 90.8   Liters of O2: 2   SpO2: 96   HR: 99   BP: 102/68   CBG: NA  Exercise vitals:   Highest heartrate:  112   Lowest oxygen saturation: 89   Highest blood pressure: 126/74   Liters of 02: 2  Post-exercise vitals:   SpO2: 99   HR: 100   BP: 98/58   Liters of O2: 2   CBG: NA Dr. Brand Males, Medical Director Dr. Aileen Fass is immediately available during today's Pulmonary Rehab session for [name] on 11/24/2013 at 1330 class time.

## 2013-11-29 ENCOUNTER — Encounter (HOSPITAL_COMMUNITY)
Admission: RE | Admit: 2013-11-29 | Discharge: 2013-11-29 | Disposition: A | Discharge status: Home / Self Care | Payer: Medicare Other | Source: Ambulatory Visit | Attending: Internal Medicine | Admitting: Internal Medicine

## 2013-11-29 DIAGNOSIS — Z5189 Encounter for other specified aftercare: Secondary | ICD-10-CM | POA: Insufficient documentation

## 2013-11-29 DIAGNOSIS — J8410 Pulmonary fibrosis, unspecified: Secondary | ICD-10-CM | POA: Insufficient documentation

## 2013-11-29 NOTE — Progress Notes (Signed)
Today, Londen exercised at [facility]. Service time was from 1:30 to 3:15.  The patient exercised for more than 31 minutes performing aerobic, strengthening, and stretching exercises. Oxygen saturation, heart rate, blood pressure, rate of perceived exertion, and shortness of breath were all monitored before, during, and after exercise. Lotus presented with no problems at today's exercise session.   There was no workload change during today's exercise session.  Pre-exercise vitals:   Weight kg: 92.2   Liters of O2: 2   SpO2: 96   HR: 91   BP: 82/42 recheck: 106/54   CBG: na  Exercise vitals:   Highest heartrate:  105   Lowest oxygen saturation: 89   Highest blood pressure: 126/64   Liters of 02: 6  Post-exercise vitals:   SpO2: 99   HR: 94   BP: 100/50   Liters of O2: 2   CBG: na  Dr. Brand Males, Medical Director Dr. Aileen Fass is immediately available during today's Pulmonary Rehab session for [name] on 11/29/13 at 1:30pm class time.

## 2013-12-01 ENCOUNTER — Encounter (HOSPITAL_COMMUNITY)
Admission: RE | Admit: 2013-12-01 | Discharge: 2013-12-01 | Disposition: A | Discharge status: Home / Self Care | Payer: Medicare Other | Source: Ambulatory Visit | Attending: Internal Medicine | Admitting: Internal Medicine

## 2013-12-01 NOTE — Progress Notes (Signed)
Today, [name] exercised at [facility]. Service time was from 1:30 to 3:15.  The patient exercised for more than 31 minutes performing aerobic, strengthening, and stretching exercises. Oxygen saturation, heart rate, blood pressure, rate of perceived exertion, and shortness of breath were all monitored before, during, and after exercise. [name] presented with no problems at today's exercise session. The patient attended the education class "MD Lecture Day" with Dr. Chase Caller.  There was no workload change during today's exercise session.  Pre-exercise vitals:   Weight kg: 90.7   Liters of O2: 2   SpO2: 93   HR: 100   BP: 98/64   CBG: na  Exercise vitals:   Highest heartrate:  117   Lowest oxygen saturation: 83% stopped forrest and reinforce pursed lip breathing increased to 90%   Highest blood pressure: 114/70   Liters of 02: 6  Post-exercise vitals:   SpO2: 99   HR: 90   BP: 110/60   Liters of O2: 2   CBG: na  Dr. Brand Males, Medical Director Dr. Coralyn Pear is immediately available during today's Pulmonary Rehab session for [name] on 12/01/13 at 1:30 class time.

## 2013-12-06 ENCOUNTER — Encounter (HOSPITAL_COMMUNITY): Payer: Medicare Other

## 2013-12-08 ENCOUNTER — Encounter (HOSPITAL_COMMUNITY)
Admission: RE | Admit: 2013-12-08 | Discharge: 2013-12-08 | Disposition: A | Discharge status: Home / Self Care | Payer: Medicare Other | Source: Ambulatory Visit | Attending: Internal Medicine | Admitting: Internal Medicine

## 2013-12-08 NOTE — Progress Notes (Signed)
Today, Ary exercised at [facility]. Service time was from 1:30pm to 3:30pm.  The patient exercised for more than 31 minutes performing aerobic, strengthening, and stretching exercises. Oxygen saturation, heart rate, blood pressure, rate of perceived exertion, and shortness of breath were all monitored before, during, and after exercise. Bryse presented with no problems at today's exercise session. The patient attended "Nutrition for the Pulmonary Patient" education class with Derek Mound.  There was no workload change during today's exercise session.  Pre-exercise vitals:   Weight kg: 89.3   Liters of O2: 2   SpO2: 96   HR: 94   BP: 124/58   CBG: na  Exercise vitals:   Highest heartrate:  101   Lowest oxygen saturation: 88   Highest blood pressure: 124/66   Liters of 02: 6  Post-exercise vitals:   SpO2: 95   HR: 89   BP: 104/54   Liters of O2: 2   CBG: na  Dr. Brand Males, Medical Director Dr. Aileen Fass is immediately available during today's Pulmonary Rehab session for DAM ASHRAF on 12/08/13 at 1:30pm class time.

## 2013-12-13 ENCOUNTER — Encounter (HOSPITAL_COMMUNITY)
Admission: RE | Admit: 2013-12-13 | Discharge: 2013-12-13 | Disposition: A | Discharge status: Home / Self Care | Payer: Medicare Other | Source: Ambulatory Visit | Attending: Internal Medicine | Admitting: Internal Medicine

## 2013-12-13 NOTE — Progress Notes (Signed)
Today, Ronn exercised at [facility]. Service time was from 1330 to 1515.  The patient exercised for more than 31 minutes performing aerobic, strengthening, and stretching exercises. Oxygen saturation, heart rate, blood pressure, rate of perceived exertion, and shortness of breath were all monitored before, during, and after exercise. Yeng presented with no problems at today's exercise session.   There was no workload change during today's exercise session.  Pre-exercise vitals:   Weight kg: 90.1   Liters of O2: 2   SpO2: 98   HR: 98   BP: 108/50   CBG: NA  Exercise vitals:   Highest heartrate:  111   Lowest oxygen saturation: 92   Highest blood pressure: 110/62   Liters of 02: 6  Post-exercise vitals:   SpO2: 98   HR: 94   BP: 122/62   Liters of O2: 2   CBG: NA Dr. Brand Males, Medical Director Dr. Aileen Fass is immediately available during today's Pulmonary Rehab session for ETHYN SCHETTER on 12/13/2013 at 1330 class time.

## 2013-12-15 ENCOUNTER — Encounter (HOSPITAL_COMMUNITY): Payer: Medicare Other

## 2013-12-20 ENCOUNTER — Encounter (HOSPITAL_COMMUNITY)
Admission: RE | Admit: 2013-12-20 | Discharge: 2013-12-20 | Disposition: A | Discharge status: Home / Self Care | Payer: Medicare Other | Source: Ambulatory Visit | Attending: Internal Medicine | Admitting: Internal Medicine

## 2013-12-22 ENCOUNTER — Encounter (HOSPITAL_COMMUNITY)
Admission: RE | Admit: 2013-12-22 | Discharge: 2013-12-22 | Disposition: A | Discharge status: Home / Self Care | Payer: Medicare Other | Source: Ambulatory Visit | Attending: Internal Medicine | Admitting: Internal Medicine

## 2013-12-22 NOTE — Progress Notes (Signed)
Today, [name] exercised at [facility]. Service time was from 1330 to 1530.  The patient exercised for more than 31 minutes performing aerobic, strengthening, and stretching exercises. Oxygen saturation, heart rate, blood pressure, rate of perceived exertion, and shortness of breath were all monitored before, during, and after exercise. [name] presented with no problems at today's exercise session. [name] also attended an education session on the s/s of infection.  There was no workload change during today's exercise session.  Pre-exercise vitals:   Weight kg: 88.6   Liters of O2: 2L   SpO2: 98   HR: 88   BP: 102/60   CBG: na  Exercise vitals:   Highest heartrate:  107   Lowest oxygen saturation: 70 on RA (pt did not have take turned on) up to 90 on 6L 02 and with PLB   Highest blood pressure: 118/86   Liters of 02: 6L  Post-exercise vitals:   SpO2: 100   HR: 90   BP: 96/57 up to 98/61 with fluid intake   Liters of O2: 2L   CBG: na  Dr. Brand Males, Medical Director Dr. Aileen Fass is immediately available during today's Pulmonary Rehab session for [name] on 12/22/2013 at 1330 class time.

## 2013-12-22 NOTE — Progress Notes (Signed)
On Tuesday, 12/20/13, [name] exercised at [facility]. Service time was from 1330 to 1500.  The patient exercised for more than 31 minutes performing aerobic, strengthening, and stretching exercises. Oxygen saturation, heart rate, blood pressure, rate of perceived exertion, and shortness of breath were all monitored before, during, and after exercise. [name] presented with no problems at today's exercise session.   There was no workload change during today's exercise session.  Pre-exercise vitals:   Weight kg: 89.2   Liters of O2: 2L   SpO2: 95   HR: 95   BP: 106/60   CBG: na  Exercise vitals:   Highest heartrate:  114   Lowest oxygen saturation: 81 up to 93 with PLB   Highest blood pressure: 118/60   Liters of 02: 6L  Post-exercise vitals:   SpO2: 98   HR: 94   BP: 122/64   Liters of O2: 2L   CBG: na  Dr. Brand Males, Medical Director Dr. Dyann Kief is immediately available during today's Pulmonary Rehab session for [name] on 12/20/2013 at 1330 class time.

## 2013-12-27 ENCOUNTER — Encounter (HOSPITAL_COMMUNITY)
Admission: RE | Admit: 2013-12-27 | Discharge: 2013-12-27 | Disposition: A | Discharge status: Home / Self Care | Payer: Medicare Other | Source: Ambulatory Visit | Attending: Internal Medicine | Admitting: Internal Medicine

## 2013-12-27 DIAGNOSIS — J8410 Pulmonary fibrosis, unspecified: Secondary | ICD-10-CM | POA: Insufficient documentation

## 2013-12-27 DIAGNOSIS — Z5189 Encounter for other specified aftercare: Secondary | ICD-10-CM | POA: Insufficient documentation

## 2013-12-27 NOTE — Progress Notes (Signed)
I have reviewed a Home Exercise Prescription with [name] . [name] is not currently exercising at home.  The patient was advised to walk 3 days a week for 20 minutes.  [name] and I discussed how to progress their exercise prescription.  The patient stated that their goals were to be able to get his life back and eventually have a lung transplant.  The patient stated that they understand the exercise prescription.  We reviewed exercise guidelines, target heart rate during exercise, oxygen use, weather, home pulse oximeter, endpoints for exercise, and goals.  Patient is encouraged to come to me with any questions. I will continue to follow up with the patient to assist them with progression and safety.

## 2013-12-27 NOTE — Progress Notes (Signed)
Today, [name] exercised at [facility]. Service time was from 1330 to 1515.  The patient exercised for more than 31 minutes performing aerobic, strengthening, and stretching exercises. Oxygen saturation, heart rate, blood pressure, rate of perceived exertion, and shortness of breath were all monitored before, during, and after exercise. [name] presented with a desaturation problem at today's exercise session.  He normally is on 6 liters while exercising, but today while he was on the airdyne he desaturated to 84%.  We tried him on an oxymask at 8 liters while walking on the track and he desaturated to less than 90%.  Next we increased his oxygen to 10 liters with the oxymask and again he was less than 90% saturation.  Next he was increased to 15 liters with the oxymask and saturation was greater than 90%.  Finally we tried 8 liters with the pendant and this was able to keep his saturations @ 90% while walking on the track.    There was no workload change during today's exercise session.  Pre-exercise vitals:   Weight kg: 90.5   Liters of O2: 6   SpO2: 98   HR: 101   BP: 100/60   CBG: NA  Exercise vitals:   Highest heartrate:  114   Lowest oxygen saturation: 84   Highest blood pressure: 100/52   Liters of 02: 8  Post-exercise vitals:   SpO2: 94   HR: 105   BP: 100/60   Liters of O2: 2   CBG: NA Dr. Brand Males, Medical Director Dr. Aileen Fass is immediately available during today's Pulmonary Rehab session for [name] on 12/27/2013 at 1330 class time.

## 2013-12-29 ENCOUNTER — Encounter (HOSPITAL_COMMUNITY)
Admission: RE | Admit: 2013-12-29 | Discharge: 2013-12-29 | Disposition: A | Discharge status: Home / Self Care | Payer: Medicare Other | Source: Ambulatory Visit | Attending: Internal Medicine | Admitting: Internal Medicine

## 2013-12-29 NOTE — Progress Notes (Signed)
Today, Jessy exercised at [facility]. Service time was from 1:30pm to 3:30pm.  The patient exercised for more than 31 minutes performing aerobic, strengthening, and stretching exercises. Oxygen saturation, heart rate, blood pressure, rate of perceived exertion, and shortness of breath were all monitored before, during, and after exercise. Boen presented with no problems at today's exercise session.   There was no workload change during today's exercise session.  Pre-exercise vitals:   Weight kg: 90.0   Liters of O2: 2   SpO2: 97   HR: 94   BP: 94/60   CBG: na  Exercise vitals:   Highest heartrate:  125   Lowest oxygen saturation: 85%   Highest blood pressure: 96/58   Liters of 02: 10  Post-exercise vitals:   SpO2: 98   HR: 90   BP: 106/60   Liters of O2: 6   CBG: na  Dr. Brand Males, Medical Director Dr. Dyann Kief is immediately available during today's Pulmonary Rehab session for CINQUE BEGLEY on 12/29/13 at 1:30pm class time.

## 2014-01-03 ENCOUNTER — Encounter (HOSPITAL_COMMUNITY)
Admission: RE | Admit: 2014-01-03 | Discharge: 2014-01-03 | Disposition: A | Discharge status: Home / Self Care | Payer: Medicare Other | Source: Ambulatory Visit | Attending: Internal Medicine | Admitting: Internal Medicine

## 2014-01-03 NOTE — Progress Notes (Signed)
Today, [name] exercised at [facility]. Service time was from 1330 to 1500.  The patient exercised for more than 31 minutes performing aerobic, strengthening, and stretching exercises. Oxygen saturation, heart rate, blood pressure, rate of perceived exertion, and shortness of breath were all monitored before, during, and after exercise. [name] presented with no problems at today's exercise session.   There was no workload change during today's exercise session.  Pre-exercise vitals:   Weight kg: 89.6   Liters of O2: 2L   SpO2: 97   HR: 99   BP: 98/50   CBG: na  Exercise vitals:   Highest heartrate:  110   Lowest oxygen saturation: 94   Highest blood pressure: 102/60   Liters of 02: 8L  Post-exercise vitals:   SpO2: 93   HR: 100   BP: 106/62   Liters of O2: 2L CBG: na  Dr. Brand Males, Medical Director Dr. Dyann Kief is immediately available during today's Pulmonary Rehab session for [name] on 01/03/2014 at 1330 class time.

## 2014-01-05 ENCOUNTER — Encounter (HOSPITAL_COMMUNITY)
Admission: RE | Admit: 2014-01-05 | Discharge: 2014-01-05 | Disposition: A | Discharge status: Home / Self Care | Payer: Medicare Other | Source: Ambulatory Visit | Attending: Internal Medicine | Admitting: Internal Medicine

## 2014-01-05 NOTE — Progress Notes (Signed)
Today, Tamel exercised at [facility]. Service time was from 1330 to 1530.  The patient exercised for more than 31 minutes performing aerobic, strengthening, and stretching exercises. Oxygen saturation, heart rate, blood pressure, rate of perceived exertion, and shortness of breath were all monitored before, during, and after exercise. Cannan presented with no problems at today's exercise session. Keyaan also attended an education session on advanced directives.  There was no workload change during today's exercise session.  Pre-exercise vitals:   Weight kg: 89.3   Liters of O2: 2L   SpO2: 95   HR: 97   BP: 94/50   CBG: na  Exercise vitals:   Highest heartrate:  108   Lowest oxygen saturation: 94   Highest blood pressure: 110/58   Liters of 02: 8L  Post-exercise vitals:   SpO2: 99   HR: 88   BP: 102/58   Liters of O2: 2L   CBG: na  Dr. Brand Males, Medical Director Dr. Aileen Fass is immediately available during today's Pulmonary Rehab session for DELFIN SQUILLACE on 01/05/2014 at 1330 class time.

## 2014-01-10 ENCOUNTER — Encounter (HOSPITAL_COMMUNITY)
Admission: RE | Admit: 2014-01-10 | Discharge: 2014-01-10 | Disposition: A | Discharge status: Home / Self Care | Payer: Medicare Other | Source: Ambulatory Visit | Attending: Internal Medicine | Admitting: Internal Medicine

## 2014-01-10 NOTE — Progress Notes (Signed)
Today, [name] exercised at [name]. Service time was from 1330 to 1515.  The patient exercised for more than 31 minutes performing aerobic, strengthening, and stretching exercises. Oxygen saturation, heart rate, blood pressure, rate of perceived exertion, and shortness of breath were all monitored before, during, and after exercise. [name] presented with no problems at today's exercise session.   There was no workload change during today's exercise session.  Pre-exercise vitals:   Weight kg: 89.2   Liters of O2: 2   SpO2: 93   HR: 101   BP: 96/52   CBG: NA  Exercise vitals:   Highest heartrate:  106   Lowest oxygen saturation: 94   Highest blood pressure: 110/58   Liters of 02: 8  Post-exercise vitals:   SpO2: 96   HR: 95   BP: 98/54   Liters of O2: 2   CBG: NA Dr. Brand Males, Medical Director Dr. Aileen Fass is immediately available during today's Pulmonary Rehab session for [name] on 01/10/2014 at 1330 class time.

## 2014-01-12 ENCOUNTER — Encounter (HOSPITAL_COMMUNITY)
Admission: RE | Admit: 2014-01-12 | Discharge: 2014-01-12 | Disposition: A | Discharge status: Home / Self Care | Payer: Medicare Other | Source: Ambulatory Visit | Attending: Internal Medicine | Admitting: Internal Medicine

## 2014-01-12 NOTE — Progress Notes (Addendum)
Today, Kanyon exercised at [facility]. Service time was from 1330 to 1515.  The patient exercised for more than 31 minutes performing aerobic, strengthening, and stretching exercises. Oxygen saturation, heart rate, blood pressure, rate of perceived exertion, and shortness of breath were all monitored before, during, and after exercise. Trevyn presented with no problems at today's exercise session. Emeric also attended an education session on pulmonary medications.  There was a workload change during today's exercise session.  Pre-exercise vitals:   Weight kg: 89.6   Liters of O2: 2L   SpO2: 92   HR: 92   BP: 96/52   CBG: na  Exercise vitals:   Highest heartrate:  105   Lowest oxygen saturation: 92   Highest blood pressure: 106/58   Liters of 02: 8L  Post-exercise vitals:   SpO2: 97   HR: 82   BP: 104/60   Liters of O2: 2L   CBG: na  Dr. Brand Males, Medical Director Dr. Dyann Kief is immediately available during today's Pulmonary Rehab session for JARNELL CORDARO on 01/12/2014 at 1330 class time.

## 2014-01-17 ENCOUNTER — Encounter (HOSPITAL_COMMUNITY): Payer: Medicare Other

## 2014-01-19 ENCOUNTER — Encounter (HOSPITAL_COMMUNITY): Payer: Medicare Other

## 2014-01-24 ENCOUNTER — Encounter (HOSPITAL_COMMUNITY)
Admission: RE | Admit: 2014-01-24 | Discharge: 2014-01-24 | Disposition: A | Discharge status: Home / Self Care | Payer: Medicare Other | Source: Ambulatory Visit | Attending: Internal Medicine | Admitting: Internal Medicine

## 2014-01-24 NOTE — Progress Notes (Signed)
Today, [name] exercised at [employer]. [facility]. Service time was from 1330 to 1515.  The patient exercised for more than 31 minutes performing aerobic, strengthening, and stretching exercises. Oxygen saturation, heart rate, blood pressure, rate of perceived exertion, and shortness of breath were all monitored before, during, and after exercise. [name] presented with no problems at today's exercise session, however he desaturated more quickly with exercise. [name] stated yesterday was a good day and he was able to walk for 19min on 8L O2 without significant desaturation.  There was no workload change during today's exercise session.  Pre-exercise vitals:   Weight kg: 89.9   Liters of O2: 2L   SpO2: 95   HR: 93   BP: 98/52   CBG: na  Exercise vitals:   Highest heartrate:  117   Lowest oxygen saturation: 81 increased to 92 with PLB and an increase of O2 to 10L   Highest blood pressure: 100/64   Liters of 02: 10L  Post-exercise vitals:   SpO2: 94    HR: 103   BP: 108/60   Liters of O2: 8L   CBG: na  Dr. Brand Males, Medical Director Dr. Aileen Fass is immediately available during today's Pulmonary Rehab session for [name] on 01/24/2014 at 1330 class time.

## 2014-01-26 ENCOUNTER — Encounter (HOSPITAL_COMMUNITY): Payer: Medicare Other

## 2014-01-31 ENCOUNTER — Encounter (HOSPITAL_COMMUNITY)
Admission: RE | Admit: 2014-01-31 | Discharge: 2014-01-31 | Disposition: A | Discharge status: Home / Self Care | Payer: Medicare Other | Source: Ambulatory Visit | Attending: Internal Medicine | Admitting: Internal Medicine

## 2014-01-31 DIAGNOSIS — J8410 Pulmonary fibrosis, unspecified: Secondary | ICD-10-CM | POA: Insufficient documentation

## 2014-01-31 DIAGNOSIS — Z5189 Encounter for other specified aftercare: Secondary | ICD-10-CM | POA: Diagnosis not present

## 2014-01-31 NOTE — Progress Notes (Signed)
[name] completed a Six-Minute Walk Test on 01/31/14 . [name] walked 929 feet with 0 breaks.  The patient's lowest oxygen saturation was 88 , highest heart rate was 123 , and highest blood pressure was 104/50. The patient was on 8 liters of oxygen with a nasal cannula.

## 2014-02-02 ENCOUNTER — Encounter (HOSPITAL_COMMUNITY): Payer: Medicare Other

## 2014-02-02 ENCOUNTER — Other Ambulatory Visit (HOSPITAL_COMMUNITY): Payer: Self-pay | Admitting: Family Medicine

## 2014-02-02 DIAGNOSIS — M810 Age-related osteoporosis without current pathological fracture: Secondary | ICD-10-CM

## 2014-02-06 ENCOUNTER — Ambulatory Visit (HOSPITAL_COMMUNITY)
Admission: RE | Admit: 2014-02-06 | Discharge: 2014-02-06 | Disposition: A | Discharge status: Home / Self Care | Payer: Medicare Other | Source: Ambulatory Visit | Attending: Family Medicine | Admitting: Family Medicine

## 2014-02-06 DIAGNOSIS — M810 Age-related osteoporosis without current pathological fracture: Secondary | ICD-10-CM

## 2014-02-07 ENCOUNTER — Encounter (HOSPITAL_COMMUNITY): Payer: Medicare Other

## 2014-02-09 ENCOUNTER — Encounter (HOSPITAL_COMMUNITY): Payer: Medicare Other

## 2014-02-14 ENCOUNTER — Encounter (HOSPITAL_COMMUNITY): Payer: Medicare Other

## 2014-02-16 ENCOUNTER — Encounter (HOSPITAL_COMMUNITY): Payer: Medicare Other

## 2014-02-28 ENCOUNTER — Encounter: Payer: Self-pay | Admitting: Internal Medicine

## 2014-04-11 HISTORY — PX: LUNG TRANSPLANT, SINGLE: SHX705

## 2014-06-08 ENCOUNTER — Encounter (HOSPITAL_COMMUNITY): Payer: Self-pay | Admitting: *Deleted

## 2014-06-08 ENCOUNTER — Emergency Department (HOSPITAL_COMMUNITY)
Admission: EM | Admit: 2014-06-08 | Discharge: 2014-06-08 | Disposition: A | Discharge status: Home / Self Care | Payer: Medicare Other | Attending: Emergency Medicine | Admitting: Emergency Medicine

## 2014-06-08 DIAGNOSIS — Y9389 Activity, other specified: Secondary | ICD-10-CM | POA: Insufficient documentation

## 2014-06-08 DIAGNOSIS — Z79899 Other long term (current) drug therapy: Secondary | ICD-10-CM | POA: Insufficient documentation

## 2014-06-08 DIAGNOSIS — Z86711 Personal history of pulmonary embolism: Secondary | ICD-10-CM | POA: Insufficient documentation

## 2014-06-08 DIAGNOSIS — X58XXXA Exposure to other specified factors, initial encounter: Secondary | ICD-10-CM | POA: Insufficient documentation

## 2014-06-08 DIAGNOSIS — Z4801 Encounter for change or removal of surgical wound dressing: Secondary | ICD-10-CM | POA: Diagnosis present

## 2014-06-08 DIAGNOSIS — Y998 Other external cause status: Secondary | ICD-10-CM | POA: Insufficient documentation

## 2014-06-08 DIAGNOSIS — S2191XA Laceration without foreign body of unspecified part of thorax, initial encounter: Secondary | ICD-10-CM | POA: Diagnosis not present

## 2014-06-08 DIAGNOSIS — Y9289 Other specified places as the place of occurrence of the external cause: Secondary | ICD-10-CM | POA: Diagnosis not present

## 2014-06-08 DIAGNOSIS — R0602 Shortness of breath: Secondary | ICD-10-CM

## 2014-06-08 DIAGNOSIS — IMO0002 Reserved for concepts with insufficient information to code with codable children: Secondary | ICD-10-CM

## 2014-06-08 DIAGNOSIS — Z87891 Personal history of nicotine dependence: Secondary | ICD-10-CM | POA: Diagnosis not present

## 2014-06-08 HISTORY — DX: Pulmonary fibrosis, unspecified: J84.10

## 2014-06-08 NOTE — Discharge Instructions (Signed)
Follow up tomorrow as planned with your md at [facility]

## 2014-06-08 NOTE — ED Provider Notes (Signed)
CSN: [id number]     Arrival date & time 06/08/14  2145 History  This chart was scribed for Maudry Diego, MD by Rayfield Citizen, ED Scribe. This patient was seen in room APA15/APA15 and the patient's care was started at 10:06 PM.     No chief complaint on file.  Patient is a 68 y.o. male presenting with wound check. The history is provided by the patient (pt complains of open incision). No language interpreter was used.  Wound Check This is a new problem. The current episode started 3 to 5 hours ago. The problem has not changed since onset.The symptoms are aggravated by bending and twisting. Nothing relieves the symptoms. He has tried nothing for the symptoms.     HPI Comments: OMARI MCMANAWAY is a 68 y.o. 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 He reports a recent cough and 3-4 days of associated SOB; earlier tonight, he coughed and opened his chest incision. There is oozing of blood and slight swelling of the area. He is currently on antibiotics.   Patient also reports that several days ago, he pulled a muscle in his left abdomen opening a door.   Past Medical History  Diagnosis Date  . Asbestos exposure   . DOE (dyspnea on exertion)   . Pulmonary fibrosis    Past Surgical History  Procedure Laterality Date  . Video bronchoscopy Bilateral 12/21/2012    Procedure: VIDEO BRONCHOSCOPY WITH FLUORO;  Surgeon: Rigoberto Noel, MD;  Location: [facility];  Service: Cardiopulmonary;  Laterality: Bilateral;  . Lung transplant     Family History  Problem Relation Age of Onset  . Pancreatic cancer Father   . Heart attack Brother    History  Substance Use Topics  . Smoking status: Former Smoker -- 1.50 packs/day for 20 years    Types: Cigarettes    Quit date: 07/28/1982  . Smokeless tobacco: Not on file  . Alcohol Use: Yes   Comment: occ  Review of Systems  Constitutional: Negative for appetite change and fatigue.  HENT: Negative for congestion, ear discharge and sinus pressure.   Eyes: Negative for discharge.  Respiratory: Negative for cough.   Gastrointestinal: Negative for diarrhea.  Genitourinary: Negative for frequency and hematuria.  Musculoskeletal: Negative for back pain.  Skin: Negative for rash.  Neurological: Negative for seizures.  Psychiatric/Behavioral: Negative for hallucinations.   Allergies  Imuran  Home Medications   Prior to Admission medications   Medication Sig Start Date End Date Taking? Authorizing Provider  cetirizine-pseudoephedrine (ZYRTEC-D) 5-120 MG per tablet Take 1 tablet by mouth daily.    Historical Provider, MD  cholecalciferol (VITAMIN D) 1000 UNITS tablet Take 2,000 Units by mouth daily.    Historical Provider, MD  desonide (DESOWEN) 0.05 % ointment Apply as needed 09/13/12   Historical Provider, MD  vitamin B-12 (CYANOCOBALAMIN) 1000 MCG tablet Take 1,000 mcg by mouth daily.    Historical Provider, MD   BP 125/73 mmHg  Pulse 99  Temp(Src) 98.4 F (36.9 C) (Oral)  Resp 18  Ht 5\' 10"  (1.778 m)  Wt 190 lb (86.183 kg)  BMI 27.26 kg/m2  SpO2 97% Physical Exam  Constitutional: He is oriented to person, place, and time. He appears well-developed.  HENT:  Head: Normocephalic.  Eyes: Conjunctivae and EOM are normal. No scleral icterus.  Neck: Neck  supple. No thyromegaly present.  Cardiovascular: Normal rate and regular rhythm.  Exam reveals no gallop and no friction rub.   No murmur heard. Pulmonary/Chest: No stridor. He has no wheezes. He has no rales. He exhibits no tenderness.  Abdominal: He exhibits no distension. There is no tenderness. There is no rebound.  Musculoskeletal: Normal range of motion. He exhibits no edema.  Lymphadenopathy:    He has no cervical adenopathy.  Neurological: He is oriented to person, place, and time. He exhibits normal muscle tone.  Coordination normal.  Skin: No rash noted. No erythema.  Psychiatric: He has a normal mood and affect. His behavior is normal.    ED Course  Procedures   DIAGNOSTIC STUDIES: Oxygen Saturation is 97% on RA, adequate by my interpretation.    COORDINATION OF CARE: 10:15 PM Patient returned due to bleeding around the incision area. Discussed treatment plan with pt at bedside, including replacing the recently removed staples to close the opened incision, and pt agreed to plan.  LACERATION REPAIR Performed by: Maudry Diego, MD Consent: Verbal consent obtained. Risks and benefits: risks, benefits and alternatives were discussed Patient identity confirmed: provided demographic data Time out performed prior to procedure Prepped and Draped in normal sterile fashion Wound explored Laceration Location: left anterior chest Laceration Length: 3 cm No Foreign Bodies seen or palpated Irrigation method: syringe Amount of cleaning: standard Number of sutures or staples: 8 new additional staples; 14 staples total Patient tolerance: Patient tolerated the procedure well with no immediate complications.  Labs Review Labs Reviewed - No data to display  Imaging Review No results found.   EKG Interpretation None      MDM   Final diagnoses:  SOB (shortness of breath)  The chart was scribed for me under my direct supervision.  I personally performed the history, physical, and medical decision making and all procedures in the evaluation of this patient.Maudry Diego, MD 06/10/14 [phone]

## 2014-06-08 NOTE — ED Notes (Signed)
Began to have bleeding from surgical site. Dr Roderic Palau in to see pt

## 2014-06-08 NOTE — ED Notes (Addendum)
Pt had lung transplant at [facility] on 9/15,  Now has opening of lt   anterior chest incision with sl  Oozing of blood and swelling of ant chest.   Sob for 3-4 days.  Says he pulled a muscle in lt abd opening a door

## 2014-06-08 NOTE — ED Notes (Signed)
Has suprapubic catheter

## 2014-07-18 ENCOUNTER — Emergency Department (HOSPITAL_COMMUNITY)
Admission: EM | Admit: 2014-07-18 | Discharge: 2014-07-18 | Disposition: A | Discharge status: Home / Self Care | Payer: Medicare Other | Attending: Emergency Medicine | Admitting: Emergency Medicine

## 2014-07-18 ENCOUNTER — Emergency Department (HOSPITAL_COMMUNITY): Payer: Medicare Other

## 2014-07-18 ENCOUNTER — Encounter (HOSPITAL_COMMUNITY): Payer: Self-pay | Admitting: Emergency Medicine

## 2014-07-18 DIAGNOSIS — R000 Tachycardia, unspecified: Secondary | ICD-10-CM | POA: Diagnosis not present

## 2014-07-18 DIAGNOSIS — Z942 Lung transplant status: Secondary | ICD-10-CM | POA: Insufficient documentation

## 2014-07-18 DIAGNOSIS — Z8709 Personal history of other diseases of the respiratory system: Secondary | ICD-10-CM | POA: Diagnosis not present

## 2014-07-18 DIAGNOSIS — Z79899 Other long term (current) drug therapy: Secondary | ICD-10-CM | POA: Diagnosis not present

## 2014-07-18 DIAGNOSIS — Z792 Long term (current) use of antibiotics: Secondary | ICD-10-CM | POA: Insufficient documentation

## 2014-07-18 DIAGNOSIS — Z87891 Personal history of nicotine dependence: Secondary | ICD-10-CM | POA: Diagnosis not present

## 2014-07-18 DIAGNOSIS — R0602 Shortness of breath: Secondary | ICD-10-CM | POA: Insufficient documentation

## 2014-07-18 HISTORY — DX: Lung transplant status: Z94.2

## 2014-07-18 MED ORDER — AMOXICILLIN-POT CLAVULANATE 875-125 MG PO TABS: 1.0000 | ORAL_TABLET | Freq: Two times a day (BID) | ORAL | Status: DC

## 2014-07-18 MED ORDER — AMOXICILLIN-POT CLAVULANATE 875-125 MG PO TABS
1.0000 | ORAL_TABLET | Freq: Once | ORAL | Status: AC
  Administered 2014-07-18: 1 via ORAL
  Filled 2014-07-18: qty 1

## 2014-07-18 NOTE — ED Provider Notes (Signed)
CSN: [id number]     Arrival date & time 07/18/14  1148 History  This chart was scribed for Nat Christen, MD by Zola Button, ED Scribe. This patient was seen in room APA12/APA12 and the patient's care was started at 12:22 PM.    Chief Complaint  Patient presents with  . Shortness of Breath    The history is provided by the patient. No language interpreter was used.    HPI Comments: [name] is a 68 y.o. 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 Patient had a routine bronchoscopy as well as 7 biopsies in his lower left lung done yesterday with [facility]. He reports having some soreness where he had the biopsies done, but he does note that the pain has improved since this morning. He denies fever and chest pain. His lung transplant was done on 04/11/14.   He has several doctors at [facility], but he knows his coordinator's name is [place].   Past Medical History  Diagnosis Date  . Asbestos exposure   . DOE (dyspnea on exertion)   . Pulmonary fibrosis   . 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 Video bronchoscopy Bilateral 12/21/2012    Procedure: VIDEO BRONCHOSCOPY WITH FLUORO;  Surgeon: Rigoberto Noel, MD;  Location: [facility];  Service: Cardiopulmonary;  Laterality: Bilateral;  . Lung transplant     Family History  Problem Relation Age of Onset  . Pancreatic cancer Father   . Heart attack Brother    History  Substance Use Topics  . Smoking status: Former Smoker -- 1.50 packs/day for 20 years    Types: Cigarettes    Quit date: 07/28/1982  . Smokeless tobacco: Not on file  . Alcohol Use: Yes     Comment: occ    Review of Systems  Constitutional: Negative for fever.  Respiratory: Positive for shortness of breath.   Cardiovascular: Negative for chest pain.  All other systems reviewed and are  negative.     Allergies  Imuran  Home Medications   Prior to Admission medications   Medication Sig Start Date End Date Taking? Authorizing Provider  cholecalciferol (VITAMIN D) 1000 UNITS tablet Take 2,000 Units by mouth daily.   Yes Historical Provider, MD  desonide (DESOWEN) 0.05 % ointment Apply as needed 09/13/12  Yes Historical Provider, MD  sulfamethoxazole-trimethoprim (BACTRIM,SEPTRA) 400-80 MG per tablet Take 1 tablet by mouth daily. 07/05/14  Yes Historical Provider, MD  valGANciclovir (VALCYTE) 450 MG tablet Take 450 mg by mouth 2 (two) times daily. 07/05/14  Yes Historical Provider, MD  vitamin B-12 (CYANOCOBALAMIN) 1000 MCG tablet Take 1,000 mcg by mouth daily.   Yes Historical Provider, MD  amoxicillin-clavulanate (AUGMENTIN) 875-125 MG per tablet Take 1 tablet by mouth 2 (two) times daily. One po bid x 7 days 07/18/14   Nat Christen, MD  cetirizine-pseudoephedrine (ZYRTEC-D) 5-120 MG per tablet Take 1 tablet by mouth daily.    Historical Provider, MD  ciprofloxacin (CIPRO) 500 MG tablet Take 500 mg by mouth 2 (two) times daily. 07/05/14   Historical Provider, MD   BP 125/72 mmHg  Pulse 89  Temp(Src) 97.8 F (36.6 C) (Oral)  Resp 27  Ht 5\' 10"  (1.778 m)  Wt 191 lb (86.637 kg)  BMI 27.41 kg/m2  SpO2 94% Physical Exam  Constitutional: He is oriented to person, place, and time.  He appears well-developed and well-nourished.  HENT:  Head: Normocephalic and atraumatic.  Eyes: Conjunctivae and EOM are normal. Pupils are equal, round, and reactive to light.  Neck: Normal range of motion. Neck supple.  Cardiovascular: Regular rhythm.   No murmur heard. Slightly tachycardic.  Pulmonary/Chest: Effort normal and breath sounds normal. No respiratory distress. He has no wheezes. He has no rales.  Abdominal: Soft. Bowel sounds are normal.  Musculoskeletal: Normal range of motion.  Neurological: He is alert and oriented to person, place, and time.  Skin: Skin is warm and dry.   Psychiatric: He has a normal mood and affect. His behavior is normal.  Nursing note and vitals reviewed.   ED Course  Procedures  DIAGNOSTIC STUDIES: Oxygen Saturation is 99% on room air, normal by my interpretation.    COORDINATION OF CARE: 12:26 PM-Discussed treatment plan which includes CXR with pt at bedside and pt agreed to plan.    Labs Review Labs Reviewed - No data to display  Imaging Review Dg Chest 2 View  07/18/2014   CLINICAL DATA:  Bronchoscopy 07/17/2014. Lung transplant 04/11/2014. Short of breath  EXAM: CHEST  2 VIEW  COMPARISON:  09/19/2013  FINDINGS: Heart size is normal.  Negative for heart failure.  Increased lung markings in the right upper lobe are similar to the prior study and may be due to scarring. Apparent left lung transplant. Left lung is now clear. There is left lower lobe airspace disease which could be atelectasis or pneumonia. There is a small left effusion.  Negative for heart failure.  IMPRESSION: Baseline study following lung transplant present. Presumably this is a unilateral left lung transplant based on clips in left hilum and similar appearance in the right lung to the preoperative study.  Left lower lobe airspace disease may represent atelectasis or pneumonia. There is a small left effusion.   Electronically Signed   By: Franchot Gallo M.D.   On: 07/18/2014 12:57     EKG Interpretation   Date/Time:  Tuesday July 18 2014 11:59:04 EST Ventricular Rate:  95 PR Interval:  127 QRS Duration: 78 QT Interval:  337 QTC Calculation: 424 R Axis:   47 Text Interpretation:  Sinus rhythm Consider anterior infarct Baseline  wander in lead(s) V3 Confirmed by Krissie Merrick  MD, Jinan Biggins ([id number]) on 07/18/2014  12:18:06 PM      MDM   Final diagnoses:  SOB (shortness of breath)    Patient is hemodynamically stable. He is slightly tachypnea take but pulse ox has been in the mid 90 range. I spoke to the transplant coordinator at [facility] name Joelene Millin at  [phone] and described the clinical findings.  She recommended Augmentin twice a day.  Patient will call transplant coordinator tomorrow for update.  I personally performed the services described in this documentation, which was scribed in my presence. The recorded information has been reviewed and is accurate.    Nat Christen, MD 07/18/14 [phone]

## 2014-07-18 NOTE — ED Notes (Signed)
Pt is recent lung transplant pt. Had broncoscopy yesterday. Reports increasin SOB over last 2 days. Pt reports he was sent for CXR.

## 2014-07-18 NOTE — ED Notes (Signed)
Patient given discharge instruction, verbalized understand. Patient ambulatory out of the department.  

## 2014-07-18 NOTE — ED Notes (Signed)
Wound vac in place, no complication

## 2014-07-18 NOTE — Discharge Instructions (Signed)
Antibiotic twice a day. Second dose tonight. Call your transplant coordinator tomorrow.

## 2014-12-05 ENCOUNTER — Emergency Department (HOSPITAL_COMMUNITY)
Admission: EM | Admit: 2014-12-05 | Discharge: 2014-12-06 | Disposition: A | Discharge status: Other Acute Inpatient Hospital | Payer: Medicare Other | Attending: Emergency Medicine | Admitting: Emergency Medicine

## 2014-12-05 ENCOUNTER — Encounter (HOSPITAL_COMMUNITY): Payer: Self-pay | Admitting: *Deleted

## 2014-12-05 ENCOUNTER — Emergency Department (HOSPITAL_COMMUNITY): Payer: Medicare Other

## 2014-12-05 DIAGNOSIS — R0682 Tachypnea, not elsewhere classified: Secondary | ICD-10-CM | POA: Insufficient documentation

## 2014-12-05 DIAGNOSIS — R000 Tachycardia, unspecified: Secondary | ICD-10-CM | POA: Insufficient documentation

## 2014-12-05 DIAGNOSIS — A419 Sepsis, unspecified organism: Secondary | ICD-10-CM | POA: Diagnosis not present

## 2014-12-05 DIAGNOSIS — Z792 Long term (current) use of antibiotics: Secondary | ICD-10-CM | POA: Insufficient documentation

## 2014-12-05 DIAGNOSIS — R509 Fever, unspecified: Secondary | ICD-10-CM | POA: Diagnosis present

## 2014-12-05 DIAGNOSIS — Z79899 Other long term (current) drug therapy: Secondary | ICD-10-CM | POA: Insufficient documentation

## 2014-12-05 DIAGNOSIS — Z8709 Personal history of other diseases of the respiratory system: Secondary | ICD-10-CM | POA: Insufficient documentation

## 2014-12-05 DIAGNOSIS — Z87891 Personal history of nicotine dependence: Secondary | ICD-10-CM | POA: Diagnosis not present

## 2014-12-05 DIAGNOSIS — R4182 Altered mental status, unspecified: Secondary | ICD-10-CM | POA: Diagnosis not present

## 2014-12-05 LAB — COMPREHENSIVE METABOLIC PANEL WITH GFR
ALT: 9 U/L — ABNORMAL LOW (ref 17–63)
AST: 25 U/L (ref 15–41)
Albumin: 2.7 g/dL — ABNORMAL LOW (ref 3.5–5.0)
Alkaline Phosphatase: 100 U/L (ref 38–126)
Anion gap: 11 (ref 5–15)
BUN: 35 mg/dL — ABNORMAL HIGH (ref 6–20)
CO2: 20 mmol/L — ABNORMAL LOW (ref 22–32)
Calcium: 8.2 mg/dL — ABNORMAL LOW (ref 8.9–10.3)
Chloride: 107 mmol/L (ref 101–111)
Creatinine, Ser: 3.3 mg/dL — ABNORMAL HIGH (ref 0.61–1.24)
GFR calc Af Amer: 20 mL/min — ABNORMAL LOW
GFR calc non Af Amer: 18 mL/min — ABNORMAL LOW
Glucose, Bld: 121 mg/dL — ABNORMAL HIGH (ref 70–99)
Potassium: 4.8 mmol/L (ref 3.5–5.1)
Sodium: 138 mmol/L (ref 135–145)
Total Bilirubin: 0.8 mg/dL (ref 0.3–1.2)
Total Protein: 5.2 g/dL — ABNORMAL LOW (ref 6.5–8.1)

## 2014-12-05 LAB — CBC WITH DIFFERENTIAL/PLATELET
BASOS ABS: 0 10*3/uL (ref 0.0–0.1)
Basophils Relative: 0 % (ref 0–1)
Eosinophils Absolute: 0 10*3/uL (ref 0.0–0.7)
Eosinophils Relative: 1 % (ref 0–5)
HCT: 29.5 % — ABNORMAL LOW (ref 39.0–52.0)
Hemoglobin: 9.7 g/dL — ABNORMAL LOW (ref 13.0–17.0)
LYMPHS PCT: 8 % — AB (ref 12–46)
Lymphs Abs: 0.2 10*3/uL — ABNORMAL LOW (ref 0.7–4.0)
MCH: 31.9 pg (ref 26.0–34.0)
MCHC: 32.9 g/dL (ref 30.0–36.0)
MCV: 97 fL (ref 78.0–100.0)
MONOS PCT: 4 % (ref 3–12)
Monocytes Absolute: 0.1 10*3/uL (ref 0.1–1.0)
NEUTROS PCT: 87 % — AB (ref 43–77)
Neutro Abs: 2.3 10*3/uL (ref 1.7–7.7)
PLATELETS: 388 10*3/uL (ref 150–400)
RBC: 3.04 MIL/uL — AB (ref 4.22–5.81)
RDW: 15.4 % (ref 11.5–15.5)
WBC: 2.6 10*3/uL — AB (ref 4.0–10.5)

## 2014-12-05 LAB — URINALYSIS, ROUTINE W REFLEX MICROSCOPIC
Bilirubin Urine: NEGATIVE
Glucose, UA: NEGATIVE mg/dL
Hgb urine dipstick: NEGATIVE
Ketones, ur: NEGATIVE mg/dL
Leukocytes, UA: NEGATIVE
Nitrite: NEGATIVE
Protein, ur: 30 mg/dL — AB
Specific Gravity, Urine: 1.014 (ref 1.005–1.030)
Urobilinogen, UA: 0.2 mg/dL (ref 0.0–1.0)
pH: 5 (ref 5.0–8.0)

## 2014-12-05 LAB — BRAIN NATRIURETIC PEPTIDE: B Natriuretic Peptide: 260.1 pg/mL — ABNORMAL HIGH (ref 0.0–100.0)

## 2014-12-05 LAB — BLOOD GAS, ARTERIAL
ACID-BASE DEFICIT: 3.7 mmol/L — AB (ref 0.0–2.0)
Bicarbonate: 20.8 mEq/L (ref 20.0–24.0)
Drawn by: 41875
O2 Saturation: 96.6 %
PCO2 ART: 41.5 mmHg (ref 35.0–45.0)
Patient temperature: 101.7
TCO2: 22 mmol/L (ref 0–100)
pH, Arterial: 7.332 — ABNORMAL LOW (ref 7.350–7.450)
pO2, Arterial: 105 mmHg — ABNORMAL HIGH (ref 80.0–100.0)

## 2014-12-05 LAB — URINE MICROSCOPIC-ADD ON

## 2014-12-05 LAB — I-STAT CG4 LACTIC ACID, ED: LACTIC ACID, VENOUS: 2.82 mmol/L — AB (ref 0.5–2.0)

## 2014-12-05 LAB — TROPONIN I: TROPONIN I: 0.06 ng/mL — AB (ref <0.031)

## 2014-12-05 LAB — LIPASE, BLOOD: Lipase: 17 U/L — ABNORMAL LOW (ref 22–51)

## 2014-12-05 MED ORDER — HYDROCORTISONE NA SUCCINATE PF 100 MG IJ SOLR
50.0000 mg | Freq: Once | INTRAMUSCULAR | Status: AC
  Administered 2014-12-05: 50 mg via INTRAVENOUS
  Filled 2014-12-05: qty 2

## 2014-12-05 MED ORDER — ACETAMINOPHEN 650 MG RE SUPP
650.0000 mg | RECTAL | Status: DC | PRN
  Administered 2014-12-05: 650 mg via RECTAL

## 2014-12-05 MED ORDER — VANCOMYCIN HCL IN DEXTROSE 750-5 MG/150ML-% IV SOLN: 750.0000 mg | INTRAVENOUS | Status: DC

## 2014-12-05 MED ORDER — SODIUM CHLORIDE 0.9 % IV BOLUS (SEPSIS)
1000.0000 mL | Freq: Once | INTRAVENOUS | Status: AC
  Administered 2014-12-05: 1000 mL via INTRAVENOUS

## 2014-12-05 MED ORDER — PIPERACILLIN-TAZOBACTAM 3.375 G IVPB
3.3750 g | Freq: Once | INTRAVENOUS | Status: AC
  Administered 2014-12-05: 3.375 g via INTRAVENOUS
  Filled 2014-12-05: qty 50

## 2014-12-05 MED ORDER — VANCOMYCIN HCL 10 G IV SOLR
1250.0000 mg | Freq: Once | INTRAVENOUS | Status: AC
  Administered 2014-12-05: 1250 mg via INTRAVENOUS
  Filled 2014-12-05: qty 1250

## 2014-12-05 MED ORDER — SODIUM CHLORIDE 0.9 % IV SOLN
Freq: Once | INTRAVENOUS | Status: AC
  Administered 2014-12-05: 21:00:00 via INTRAVENOUS

## 2014-12-05 NOTE — ED Notes (Signed)
Patient arrived via EMS.  EMS administered Adenosine '6mg'$  and '12mg'$  for the heart rate of 150-160.  Rate did not decrease and Cardizem '10mg'$  and then '10mg'$  was given without much change in heart rate.

## 2014-12-05 NOTE — ED Provider Notes (Signed)
CSN: [id number]     Arrival date & time 12/05/14  1925 History   First MD Initiated Contact with Patient 12/05/14 1926     No chief complaint on file.   HPI  Patient presents in respiratory distress from home. Patient falls asleep quickly, but when awake states that he has difficult breathing, feels weak. He has negligible specific pain. History of present illness provided by family members, EMS. He states that the patient had outpatient visit today at [facility] transplant center. They state that over the past week he has had ongoing cough, fatigue. Today the patient had x-ray, labs performed at [facility]. Patient was discharged, after informed of possible acute rejection, as well as worsening renal function. Family states that after discharge the patient became noticeably worse, and by the time of arrival home, was minimally capable of walking secondary to dyspnea.   Past Medical History  Diagnosis Date  . Asbestos exposure   . DOE (dyspnea on exertion)   . Pulmonary fibrosis   . 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 Video bronchoscopy Bilateral 12/21/2012    Procedure: VIDEO BRONCHOSCOPY WITH FLUORO;  Surgeon: Rigoberto Noel, MD;  Location: [facility];  Service: Cardiopulmonary;  Laterality: Bilateral;  . Lung transplant     Family History  Problem Relation Age of Onset  . Pancreatic cancer Father   . Heart attack Brother    History  Substance Use Topics  . Smoking status: Former Smoker -- 1.50 packs/day for 20 years    Types: Cigarettes    Quit date: 07/28/1982  . Smokeless tobacco: Not on file  . Alcohol Use: Yes     Comment: occ    Review of Systems  Unable to perform ROS: Acuity of condition      Allergies  Imuran  Home Medications   Prior to Admission medications   Medication Sig Start Date End Date Taking? Authorizing Provider  amoxicillin-clavulanate (AUGMENTIN) 875-125 MG per tablet Take 1 tablet by  mouth 2 (two) times daily. One po bid x 7 days 07/18/14   Nat Christen, MD  cetirizine-pseudoephedrine (ZYRTEC-D) 5-120 MG per tablet Take 1 tablet by mouth daily.    Historical Provider, MD  cholecalciferol (VITAMIN D) 1000 UNITS tablet Take 2,000 Units by mouth daily.    Historical Provider, MD  ciprofloxacin (CIPRO) 500 MG tablet Take 500 mg by mouth 2 (two) times daily. 07/05/14   Historical Provider, MD  desonide (DESOWEN) 0.05 % ointment Apply as needed 09/13/12   Historical Provider, MD  sulfamethoxazole-trimethoprim (BACTRIM,SEPTRA) 400-80 MG per tablet Take 1 tablet by mouth daily. 07/05/14   Historical Provider, MD  valGANciclovir (VALCYTE) 450 MG tablet Take 450 mg by mouth 2 (two) times daily. 07/05/14   Historical Provider, MD  vitamin B-12 (CYANOCOBALAMIN) 1000 MCG tablet Take 1,000 mcg by mouth daily.    Historical Provider, MD   There were no vitals taken for this visit. Physical Exam  Constitutional: He is oriented to person, place, and time. He appears ill. He appears distressed.  HENT:  Head: Normocephalic and atraumatic.  Eyes: Conjunctivae are normal. Right eye exhibits no discharge. Left eye exhibits no discharge.  Neck: No tracheal deviation present.  Cardiovascular: Tachycardia present.   Pulmonary/Chest: No stridor. Tachypnea noted. He is in respiratory distress. He has decreased breath sounds.  Abdominal: He exhibits no distension. There is no tenderness.  Musculoskeletal:  Extensive bilateral lower extremity swelling  Neurological: He is oriented  to person, place, and time.  Patient is listless, but when awake is oriented appropriately. He moves all extremities spontaneously.  Psychiatric: His speech is delayed. He is slowed. Cognition and memory are impaired.  Nursing note reviewed.   ED Course  Procedures (including critical care time) Labs Review Labs Reviewed  COMPREHENSIVE METABOLIC PANEL - Abnormal; Notable for the following:    CO2 20 (*)    Glucose, Bld  121 (*)    BUN 35 (*)    Creatinine, Ser 3.30 (*)    Calcium 8.2 (*)    Total Protein 5.2 (*)    Albumin 2.7 (*)    ALT 9 (*)    GFR calc non Af Amer 18 (*)    GFR calc Af Amer 20 (*)    All other components within normal limits  LIPASE, BLOOD - Abnormal; Notable for the following:    Lipase 17 (*)    All other components within normal limits  BRAIN NATRIURETIC PEPTIDE - Abnormal; Notable for the following:    B Natriuretic Peptide 260.1 (*)    All other components within normal limits  TROPONIN I - Abnormal; Notable for the following:    Troponin I 0.06 (*)    All other components within normal limits  CBC WITH DIFFERENTIAL/PLATELET - Abnormal; Notable for the following:    WBC 2.6 (*)    RBC 3.04 (*)    Hemoglobin 9.7 (*)    HCT 29.5 (*)    Neutrophils Relative % 87 (*)    Lymphocytes Relative 8 (*)    Lymphs Abs 0.2 (*)    All other components within normal limits  URINALYSIS, ROUTINE W REFLEX MICROSCOPIC - Abnormal; Notable for the following:    Color, Urine AMBER (*)    Protein, ur 30 (*)    All other components within normal limits  URINE MICROSCOPIC-ADD ON - Abnormal; Notable for the following:    Casts HYALINE CASTS (*)    All other components within normal limits  BLOOD GAS, ARTERIAL - Abnormal; Notable for the following:    pH, Arterial 7.332 (*)    pO2, Arterial 105 (*)    Acid-base deficit 3.7 (*)    All other components within normal limits  I-STAT CG4 LACTIC ACID, ED - Abnormal; Notable for the following:    Lactic Acid, Venous 2.82 (*)    All other components within normal limits  CULTURE, BLOOD (ROUTINE X 2)  CULTURE, BLOOD (ROUTINE X 2)    Imaging Review No results found.   Multiple EKGs performed, most with artifact.  EKG Interpretation   Date/Time:  Tuesday Dec 05 2014 19:41:25 EDT Ventricular Rate:  145 PR Interval:  60 QRS Duration: 111 QT Interval:  379 QTC Calculation: 589 R Axis:   96 Text Interpretation:  Sinus or ectopic atrial  tachycardia Anteroseptal  infarct, acute Prolonged QT interval regular tachycardia  T wave  abnormality Artifact Abnormal ekg Confirmed by Carmin Muskrat  MD [phone])  on 12/05/2014 8:06:32 PM      This EKG is noted with different from recent EKG at [facility], with increased rate, abnormal T-wave.   Cardiac 160 sinus tach abnormal Pulse oximetry 97% with supplemental oxygen abnormal  After the initial evaluation I reviewed the patient's chart from outside facilities, including today's lab results noting increased creatinine over the past 10 days, x-ray with concern for effusion, with interstitial changes.   9:51 PM Patient's initial resuscitation was fluids, oxygen, Tylenol. Subsequently her rate decreased, cognition  increased substantially, and blood pressure improved after initial decline to 90/50. Patient's second EKG, that was interpretable after his initial abnormal EKGs was slower On monitor the patient had a rate in the 120s, notable T-wave changes, abnormal Patient denied any chest pain.   EKG Interpretation  Date/Time:  Tuesday Dec 05 2014 20:29:30 EDT Ventricular Rate:  130 PR Interval:  129 QRS Duration: 116 QT Interval:  363 QTC Calculation: 534 R Axis:   90 Text Interpretation:  Sinus tachycardia Nonspecific intraventricular conduction delay Probable anterior infarct, age indeterminate Lateral leads are also involved Sinus tachycardia T wave abnormality Artifact Abnormal ekg Confirmed by Carmin Muskrat  MD [phone]) on 12/05/2014 9:51:24 PM      I discussed patient's case with his transplant team at [facility]. We reviewed lab changes over the day, as well as over the past weeks. We agreed on empiric broad-spectrum antibiotics given concern for sepsis, continued fluid resuscitation, and transferred to [facility].   9:52 PM On repeat exam the patient is substantially more awake, blood pressure 105/65, heart rate 120s. He and family are aware of all  results, including likely new pneumonia.  Update: Receiving team requests ABG  11:05 PM HR 120's, BP now 120/65 ABG 7.33/40/105 O2- via venti-mask MDM   Patient presents altered, and distress, diaphoretic, tachypneic, tachycardic, somnolent. Patient has a notable history of lung transplant, and there was immediate concern for sepsis. With this concern the patient received fluid resuscitation, antibiotics, Tylenol, supplemental oxygen. He had substantial clinical improvement, but remained ill, particularly given the patient's medical history, including immunosuppression secondary to transplant. After initial resuscitation here, the patient was transferred to the critical care unit at [facility].  CRITICAL CARE Performed by: Carmin Muskrat Total critical care time: 70 Critical care time was exclusive of separately billable procedures and treating other patients. Critical care was necessary to treat or prevent imminent or life-threatening deterioration. Critical care was time spent personally by me on the following activities: development of treatment plan with patient and/or surrogate as well as nursing, discussions with consultants, evaluation of patient's response to treatment, examination of patient, obtaining history from patient or surrogate, ordering and performing treatments and interventions, ordering and review of laboratory studies, ordering and review of radiographic studies, pulse oximetry and re-evaluation of patient's condition.     Carmin Muskrat, MD 12/05/14 2306

## 2014-12-05 NOTE — Progress Notes (Signed)
ANTIBIOTIC CONSULT NOTE - INITIAL  Pharmacy Consult for Vancomycin Indication: rule out sepsis  Allergies  Allergen Reactions  . Imuran [Azathioprine]     Patient Measurements: Height: '5\' 10"'$  (177.8 cm) Weight: 174 lb (78.926 kg) IBW/kg (Calculated) : 73 Adjusted Body Weight:   Vital Signs: Temp: 103.9 F (39.9 C) (05/10 1940) Temp Source: Rectal (05/10 1940) BP: 138/65 mmHg (05/10 1940) Pulse Rate: 152 (05/10 1940) Intake/Output from previous day:   Intake/Output from this shift:    Labs:  Recent Labs  12/05/14 1953  WBC 2.6*  HGB 9.7*  PLT 388  CREATININE 3.30*   Estimated Creatinine Clearance: 21.5 mL/min (by C-G formula based on Cr of 3.3). No results for input(s): VANCOTROUGH, VANCOPEAK, VANCORANDOM, GENTTROUGH, GENTPEAK, GENTRANDOM, TOBRATROUGH, TOBRAPEAK, TOBRARND, AMIKACINPEAK, AMIKACINTROU, AMIKACIN in the last 72 hours.   Microbiology: No results found for this or any previous visit (from the past 720 hour(s)).  Medical History: Past Medical History  Diagnosis Date  . Asbestos exposure   . DOE (dyspnea on exertion)   . Pulmonary fibrosis   . 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 piperacillin-tazobactam (ZOSYN)  IV     Assessment: 69yo male with history of pulmonary fibrosis and lung transplant presents with tachycardia, fever, and confusion. Pharmacy is consulted to dose vancomycin for suspected sepsis. Pt is febrile to 103.9, WBC 2.6, sCr 3.3.  Pt received zosyn 3.375g IV once in the ED.  Goal of Therapy:  Vancomycin trough level 15-20 mcg/ml  Plan:  Vancomycin '1250mg'$  IV once followed by '750mg'$  q24h Measure antibiotic drug levels at steady state Follow up culture results, renal function, and clinical course  Andrey Cota. Diona Foley, PharmD Clinical Pharmacist Pager [phone] 12/05/2014,9:06 PM

## 2014-12-05 NOTE — ED Notes (Signed)
Patient cleaned up of stool and urine.  Rectal temp done and 2 red areas noted on buttock.  Skin peeling off left buttock.  No drainage.

## 2014-12-05 NOTE — ED Notes (Signed)
Patient arrived via EMS.  Patient was seen at [facility] earlier today and was on the way home and when he arrived at the house his ride was unable to get him out of the car.  Called his daughter and he was taken to her house.  They called EMS because his heart rate was up, had fever and was confused.  Upon arrival to the ED patient was found to have urinated on himself and was incont of stool hot to touch

## 2014-12-06 DIAGNOSIS — Z79899 Other long term (current) drug therapy: Secondary | ICD-10-CM | POA: Diagnosis not present

## 2014-12-06 DIAGNOSIS — A419 Sepsis, unspecified organism: Secondary | ICD-10-CM | POA: Diagnosis not present

## 2014-12-06 DIAGNOSIS — R0682 Tachypnea, not elsewhere classified: Secondary | ICD-10-CM | POA: Diagnosis not present

## 2014-12-06 DIAGNOSIS — R509 Fever, unspecified: Secondary | ICD-10-CM | POA: Diagnosis present

## 2014-12-06 DIAGNOSIS — Z8709 Personal history of other diseases of the respiratory system: Secondary | ICD-10-CM | POA: Diagnosis not present

## 2014-12-06 DIAGNOSIS — R000 Tachycardia, unspecified: Secondary | ICD-10-CM | POA: Diagnosis not present

## 2014-12-06 DIAGNOSIS — Z87891 Personal history of nicotine dependence: Secondary | ICD-10-CM | POA: Diagnosis not present

## 2014-12-06 DIAGNOSIS — R4182 Altered mental status, unspecified: Secondary | ICD-10-CM | POA: Diagnosis not present

## 2014-12-06 DIAGNOSIS — Z792 Long term (current) use of antibiotics: Secondary | ICD-10-CM | POA: Diagnosis not present

## 2014-12-12 LAB — CULTURE, BLOOD (ROUTINE X 2)
Culture: NO GROWTH
Culture: NO GROWTH

## 2015-01-22 ENCOUNTER — Other Ambulatory Visit: Payer: Self-pay

## 2015-02-06 NOTE — Patient Outreach (Signed)
[place] [facility]) Care Management  02/06/2015  [name] 01-20-1946 [id number]   Referral from Eldora List, Sherrin Daisy, RN assigned to outreach.  [name]. [name], [name] Management [place] Assistant Phone: [phone] Fax: [phone]

## 2015-03-01 ENCOUNTER — Other Ambulatory Visit: Payer: Self-pay | Admitting: *Deleted

## 2015-03-02 NOTE — Patient Outreach (Signed)
[place] [facility]) Care Management  03/02/2015  LENA FIELDHOUSE 1945/08/26 [id number]  High Risk referral:  Telephone call to patient who gave HIPPA verification. Patient advised of reason for call and of [facility] care management services.   Patient states he had lung transplant in 2015 and receives all of his medical follow ups at [facility]. States does not have primary provider in this area and is not seeing Dr Olena Heckle in [facility].   States if case management is needed he would consult with his medical team at [facility].  Plan: Close case. Sherrin Daisy, RN BSN [place] Management Coordinator [facility] Care Management  [phone]

## 2015-03-09 NOTE — Patient Outreach (Signed)
[place] [facility]) Care Management  03/02/2015  MALAK ORANTES 1945-12-14 [id number]   Notification from Sherrin Daisy, RN to close case due to patient does not meet program criteria for [place] Management services.  Thanks, [name]. [place], [place] Assistant Phone: [phone] Fax: [phone]

## 2016-05-07 ENCOUNTER — Encounter: Payer: Self-pay | Admitting: Internal Medicine

## 2016-11-19 ENCOUNTER — Other Ambulatory Visit: Payer: Self-pay | Admitting: Family Medicine

## 2016-12-03 ENCOUNTER — Other Ambulatory Visit: Payer: Self-pay | Admitting: Family Medicine

## 2017-06-26 DIAGNOSIS — C3490 Malignant neoplasm of unspecified part of unspecified bronchus or lung: Secondary | ICD-10-CM

## 2017-06-26 HISTORY — DX: Malignant neoplasm of unspecified part of unspecified bronchus or lung: C34.90

## 2017-07-14 ENCOUNTER — Ambulatory Visit
Admission: RE | Admit: 2017-07-14 | Discharge: 2017-07-14 | Disposition: A | Discharge status: Home / Self Care | Payer: Medicare Other | Source: Ambulatory Visit | Attending: Radiation Oncology | Admitting: Radiation Oncology

## 2017-07-14 ENCOUNTER — Ambulatory Visit
Admission: RE | Admit: 2017-07-14 | Discharge: 2017-07-14 | Disposition: A | Discharge status: Home / Self Care | Payer: Self-pay | Source: Ambulatory Visit | Attending: Radiation Oncology | Admitting: Radiation Oncology

## 2017-07-14 ENCOUNTER — Other Ambulatory Visit: Payer: Self-pay | Admitting: Radiation Oncology

## 2017-07-14 ENCOUNTER — Encounter: Payer: Self-pay | Admitting: Radiation Oncology

## 2017-07-14 DIAGNOSIS — C801 Malignant (primary) neoplasm, unspecified: Secondary | ICD-10-CM

## 2017-07-14 NOTE — Progress Notes (Signed)
Thoracic Location of Tumor / Histology: Right Upper  lobe lung , Right lower lobe lung   Patient presented  months ago with symptoms of:   Biopsies of  (if applicable) revealed: 16/01/09: Cytology  Needle aspiration biopsy: lymph node,station 7, endobronchial U/S guided =Squamous cell Carcinoma  Tobacco/Marijuana/Snuff/ETOH use: cigarette smoker 1.5ppd X  15 years;quit 08/23/1982,nevr used smokeless tobacco,no alcohol or drug use  Past/Anticipated interventions by cardiothoracic surgery, if any NO  Past/Anticipated interventions by medical oncology, if any:   Signs/Symptoms  Weight changes, if any: 5 lbs loss past month  Respiratory complaints, if any:SOB  Hemoptysis, if any: No  Pain issues, if any: No  SAFETY ISSUES:  Prior radiation? NO  Pacemaker/ICD? NO  Is the patient on methotrexate? NO  Current Complaints / other d patient believes intestinal cancer   BP (!) 144/78   Pulse 92   Temp 98.5 F (36.9 C) (Oral)   Resp 20   Ht 5\' 10"  (1.778 m)   Wt 162 lb 9.6 oz (73.8 kg)   SpO2 100%   BMI 23.33 kg/m   Wt Readings from Last 3 Encounters:  07/16/17 162 lb 9.6 oz (73.8 kg)  12/05/14 174 lb (78.9 kg)  07/18/14 191 lb (86.6 kg)

## 2017-07-16 ENCOUNTER — Encounter: Payer: Self-pay | Admitting: Radiation Oncology

## 2017-07-16 ENCOUNTER — Ambulatory Visit
Admission: RE | Admit: 2017-07-16 | Discharge: 2017-07-16 | Disposition: A | Discharge status: Home / Self Care | Payer: Medicare Other | Source: Ambulatory Visit | Attending: Radiation Oncology | Admitting: Radiation Oncology

## 2017-07-16 VITALS — BP 144/78 | HR 92 | Temp 98.5°F | Resp 20 | Ht 70.0 in | Wt 162.6 lb

## 2017-07-16 DIAGNOSIS — Z510 Encounter for antineoplastic radiation therapy: Secondary | ICD-10-CM | POA: Diagnosis present

## 2017-07-16 DIAGNOSIS — Z800 Family history of malignant neoplasm of digestive organs: Secondary | ICD-10-CM | POA: Diagnosis not present

## 2017-07-16 DIAGNOSIS — Z942 Lung transplant status: Secondary | ICD-10-CM | POA: Diagnosis not present

## 2017-07-16 DIAGNOSIS — Z888 Allergy status to other drugs, medicaments and biological substances status: Secondary | ICD-10-CM | POA: Diagnosis not present

## 2017-07-16 DIAGNOSIS — Z8249 Family history of ischemic heart disease and other diseases of the circulatory system: Secondary | ICD-10-CM | POA: Insufficient documentation

## 2017-07-16 DIAGNOSIS — Z79899 Other long term (current) drug therapy: Secondary | ICD-10-CM | POA: Diagnosis not present

## 2017-07-16 DIAGNOSIS — Z8614 Personal history of Methicillin resistant Staphylococcus aureus infection: Secondary | ICD-10-CM | POA: Diagnosis not present

## 2017-07-16 DIAGNOSIS — C3431 Malignant neoplasm of lower lobe, right bronchus or lung: Secondary | ICD-10-CM | POA: Insufficient documentation

## 2017-07-16 DIAGNOSIS — Z77090 Contact with and (suspected) exposure to asbestos: Secondary | ICD-10-CM | POA: Insufficient documentation

## 2017-07-16 DIAGNOSIS — R634 Abnormal weight loss: Secondary | ICD-10-CM | POA: Diagnosis not present

## 2017-07-16 DIAGNOSIS — Z87891 Personal history of nicotine dependence: Secondary | ICD-10-CM | POA: Insufficient documentation

## 2017-07-16 DIAGNOSIS — Z85828 Personal history of other malignant neoplasm of skin: Secondary | ICD-10-CM | POA: Insufficient documentation

## 2017-07-16 DIAGNOSIS — Z6823 Body mass index (BMI) 23.0-23.9, adult: Secondary | ICD-10-CM | POA: Diagnosis not present

## 2017-07-16 HISTORY — DX: Malignant neoplasm of unspecified part of unspecified bronchus or lung: C34.90

## 2017-07-16 HISTORY — DX: Unspecified malignant neoplasm of skin, unspecified: C44.90

## 2017-07-16 NOTE — Progress Notes (Signed)
Please see the Nurse Progress Note in the MD Initial Consult Encounter for this patient. 
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
## 2017-07-17 ENCOUNTER — Encounter: Payer: Self-pay | Admitting: Radiation Oncology

## 2017-07-17 NOTE — Progress Notes (Signed)
Rec'd FMLA forms from Dr. Lisbeth Renshaw already completed and ready to mail back to patient's daughter's home. Copy is scanned into chart.

## 2017-07-22 ENCOUNTER — Ambulatory Visit
Admission: RE | Admit: 2017-07-22 | Discharge: 2017-07-22 | Disposition: A | Discharge status: Home / Self Care | Payer: Medicare Other | Source: Ambulatory Visit | Attending: Radiation Oncology | Admitting: Radiation Oncology

## 2017-07-22 DIAGNOSIS — C3431 Malignant neoplasm of lower lobe, right bronchus or lung: Secondary | ICD-10-CM

## 2017-07-22 DIAGNOSIS — Z510 Encounter for antineoplastic radiation therapy: Secondary | ICD-10-CM | POA: Diagnosis not present

## 2017-07-24 DIAGNOSIS — Z510 Encounter for antineoplastic radiation therapy: Secondary | ICD-10-CM | POA: Diagnosis not present

## 2017-07-28 DIAGNOSIS — Z6823 Body mass index (BMI) 23.0-23.9, adult: Secondary | ICD-10-CM | POA: Diagnosis not present

## 2017-07-28 DIAGNOSIS — C3431 Malignant neoplasm of lower lobe, right bronchus or lung: Secondary | ICD-10-CM | POA: Diagnosis not present

## 2017-07-28 DIAGNOSIS — Z942 Lung transplant status: Secondary | ICD-10-CM | POA: Diagnosis not present

## 2017-07-28 DIAGNOSIS — Z800 Family history of malignant neoplasm of digestive organs: Secondary | ICD-10-CM | POA: Diagnosis not present

## 2017-07-28 DIAGNOSIS — Z85828 Personal history of other malignant neoplasm of skin: Secondary | ICD-10-CM | POA: Diagnosis not present

## 2017-07-28 DIAGNOSIS — Z87891 Personal history of nicotine dependence: Secondary | ICD-10-CM | POA: Diagnosis not present

## 2017-07-28 DIAGNOSIS — Z79899 Other long term (current) drug therapy: Secondary | ICD-10-CM | POA: Diagnosis not present

## 2017-07-28 DIAGNOSIS — Z77090 Contact with and (suspected) exposure to asbestos: Secondary | ICD-10-CM | POA: Diagnosis not present

## 2017-07-28 DIAGNOSIS — Z510 Encounter for antineoplastic radiation therapy: Secondary | ICD-10-CM | POA: Diagnosis present

## 2017-07-28 DIAGNOSIS — R634 Abnormal weight loss: Secondary | ICD-10-CM | POA: Diagnosis not present

## 2017-07-28 DIAGNOSIS — Z8614 Personal history of Methicillin resistant Staphylococcus aureus infection: Secondary | ICD-10-CM | POA: Diagnosis not present

## 2017-07-28 DIAGNOSIS — Z888 Allergy status to other drugs, medicaments and biological substances status: Secondary | ICD-10-CM | POA: Diagnosis not present

## 2017-07-28 DIAGNOSIS — Z8249 Family history of ischemic heart disease and other diseases of the circulatory system: Secondary | ICD-10-CM | POA: Diagnosis not present

## 2017-07-29 ENCOUNTER — Ambulatory Visit
Admission: RE | Admit: 2017-07-29 | Discharge: 2017-07-29 | Disposition: A | Discharge status: Home / Self Care | Payer: Medicare Other | Source: Ambulatory Visit | Attending: Radiation Oncology | Admitting: Radiation Oncology

## 2017-07-29 DIAGNOSIS — Z510 Encounter for antineoplastic radiation therapy: Secondary | ICD-10-CM | POA: Diagnosis not present

## 2017-07-30 ENCOUNTER — Ambulatory Visit
Admission: RE | Admit: 2017-07-30 | Discharge: 2017-07-30 | Disposition: A | Discharge status: Home / Self Care | Payer: Medicare Other | Source: Ambulatory Visit | Attending: Radiation Oncology | Admitting: Radiation Oncology

## 2017-07-30 DIAGNOSIS — Z510 Encounter for antineoplastic radiation therapy: Secondary | ICD-10-CM | POA: Diagnosis not present

## 2017-07-31 ENCOUNTER — Ambulatory Visit
Admission: RE | Admit: 2017-07-31 | Discharge: 2017-07-31 | Disposition: A | Discharge status: Home / Self Care | Payer: Medicare Other | Source: Ambulatory Visit | Attending: Radiation Oncology | Admitting: Radiation Oncology

## 2017-07-31 DIAGNOSIS — Z510 Encounter for antineoplastic radiation therapy: Secondary | ICD-10-CM | POA: Diagnosis not present

## 2017-07-31 DIAGNOSIS — C3431 Malignant neoplasm of lower lobe, right bronchus or lung: Secondary | ICD-10-CM

## 2017-07-31 MED ORDER — SONAFINE EX EMUL
1.0000 "application " | Freq: Two times a day (BID) | CUTANEOUS | Status: DC
  Administered 2017-07-31: 1 via TOPICAL

## 2017-08-03 ENCOUNTER — Ambulatory Visit
Admission: RE | Admit: 2017-08-03 | Discharge: 2017-08-03 | Disposition: A | Discharge status: Home / Self Care | Payer: Medicare Other | Source: Ambulatory Visit | Attending: Radiation Oncology | Admitting: Radiation Oncology

## 2017-08-03 DIAGNOSIS — Z510 Encounter for antineoplastic radiation therapy: Secondary | ICD-10-CM | POA: Diagnosis not present

## 2017-08-04 ENCOUNTER — Ambulatory Visit
Admission: RE | Admit: 2017-08-04 | Discharge: 2017-08-04 | Disposition: A | Discharge status: Home / Self Care | Payer: Medicare Other | Source: Ambulatory Visit | Attending: Radiation Oncology | Admitting: Radiation Oncology

## 2017-08-04 DIAGNOSIS — Z510 Encounter for antineoplastic radiation therapy: Secondary | ICD-10-CM | POA: Diagnosis not present

## 2017-08-05 ENCOUNTER — Ambulatory Visit
Admission: RE | Admit: 2017-08-05 | Discharge: 2017-08-05 | Disposition: A | Discharge status: Home / Self Care | Payer: Medicare Other | Source: Ambulatory Visit | Attending: Radiation Oncology | Admitting: Radiation Oncology

## 2017-08-05 DIAGNOSIS — Z510 Encounter for antineoplastic radiation therapy: Secondary | ICD-10-CM | POA: Diagnosis not present

## 2017-08-06 ENCOUNTER — Ambulatory Visit
Admission: RE | Admit: 2017-08-06 | Discharge: 2017-08-06 | Disposition: A | Discharge status: Home / Self Care | Payer: Medicare Other | Source: Ambulatory Visit | Attending: Radiation Oncology | Admitting: Radiation Oncology

## 2017-08-06 DIAGNOSIS — Z510 Encounter for antineoplastic radiation therapy: Secondary | ICD-10-CM | POA: Diagnosis not present

## 2017-08-07 ENCOUNTER — Ambulatory Visit
Admission: RE | Admit: 2017-08-07 | Discharge: 2017-08-07 | Disposition: A | Discharge status: Home / Self Care | Payer: Medicare Other | Source: Ambulatory Visit | Attending: Radiation Oncology | Admitting: Radiation Oncology

## 2017-08-07 ENCOUNTER — Other Ambulatory Visit: Payer: Self-pay | Admitting: Radiation Oncology

## 2017-08-07 DIAGNOSIS — Z510 Encounter for antineoplastic radiation therapy: Secondary | ICD-10-CM | POA: Diagnosis not present

## 2017-08-07 MED ORDER — PROCHLORPERAZINE MALEATE 10 MG PO TABS: 10.0000 mg | ORAL_TABLET | Freq: Four times a day (QID) | ORAL | 0 refills | Status: AC | PRN

## 2017-08-10 ENCOUNTER — Ambulatory Visit
Admission: RE | Admit: 2017-08-10 | Discharge: 2017-08-10 | Disposition: A | Discharge status: Home / Self Care | Payer: Medicare Other | Source: Ambulatory Visit | Attending: Radiation Oncology | Admitting: Radiation Oncology

## 2017-08-10 DIAGNOSIS — Z510 Encounter for antineoplastic radiation therapy: Secondary | ICD-10-CM | POA: Diagnosis not present

## 2017-08-11 ENCOUNTER — Ambulatory Visit
Admission: RE | Admit: 2017-08-11 | Discharge: 2017-08-11 | Disposition: A | Discharge status: Home / Self Care | Payer: Medicare Other | Source: Ambulatory Visit | Attending: Radiation Oncology | Admitting: Radiation Oncology

## 2017-08-11 DIAGNOSIS — Z510 Encounter for antineoplastic radiation therapy: Secondary | ICD-10-CM | POA: Diagnosis not present

## 2017-08-12 ENCOUNTER — Ambulatory Visit
Admission: RE | Admit: 2017-08-12 | Discharge: 2017-08-12 | Disposition: A | Discharge status: Home / Self Care | Payer: Medicare Other | Source: Ambulatory Visit | Attending: Radiation Oncology | Admitting: Radiation Oncology

## 2017-08-12 DIAGNOSIS — Z510 Encounter for antineoplastic radiation therapy: Secondary | ICD-10-CM | POA: Diagnosis not present

## 2017-08-13 ENCOUNTER — Ambulatory Visit
Admission: RE | Admit: 2017-08-13 | Discharge: 2017-08-13 | Disposition: A | Discharge status: Home / Self Care | Payer: Medicare Other | Source: Ambulatory Visit | Attending: Radiation Oncology | Admitting: Radiation Oncology

## 2017-08-13 DIAGNOSIS — Z510 Encounter for antineoplastic radiation therapy: Secondary | ICD-10-CM | POA: Diagnosis not present

## 2017-08-14 ENCOUNTER — Ambulatory Visit
Admission: RE | Admit: 2017-08-14 | Discharge: 2017-08-14 | Disposition: A | Discharge status: Home / Self Care | Payer: Medicare Other | Source: Ambulatory Visit | Attending: Radiation Oncology | Admitting: Radiation Oncology

## 2017-08-14 DIAGNOSIS — C3431 Malignant neoplasm of lower lobe, right bronchus or lung: Secondary | ICD-10-CM

## 2017-08-14 DIAGNOSIS — Z510 Encounter for antineoplastic radiation therapy: Secondary | ICD-10-CM | POA: Diagnosis not present

## 2017-08-14 MED ORDER — SUCRALFATE 1 G PO TABS: 1.0000 g | ORAL_TABLET | Freq: Four times a day (QID) | ORAL | 2 refills | Status: AC

## 2017-08-16 NOTE — Progress Notes (Signed)
  Radiation Oncology         [phone] ________________________________  Name: [name] MRN: [id number]  Date: 07/22/2017  DOB: January 28, 1946  SIMULATION AND TREATMENT PLANNING NOTE  DIAGNOSIS:     ICD-10-CM   1. 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  Identity was confirmed.  All relevant records and images related to the planned course of therapy were reviewed.   Written consent to proceed with treatment was confirmed which was freely given after reviewing the details related to the planned course of therapy had been reviewed with the patient.  Then, the patient was set-up in a stable reproducible  supine position for radiation therapy.  CT images were obtained.  Surface markings were placed.    Medically necessary complex treatment device(s) for immobilization:  accuform device.   The CT images were loaded into the planning software.  Then the target and avoidance structures were contoured.  Treatment planning then occurred.  The radiation prescription was entered and confirmed.  A total of 3 complex treatment devices were fabricated which relate to the designed radiation treatment fields. Each of these customized fields/ complex treatment devices will be used on a daily basis during the radiation course. I have requested : 3D Simulation  I have requested a DVH of the following structures: target volume, lungs, cord, heart.   The patient will undergo daily image guidance to ensure accurate localization of the target, and adequate minimize dose to the normal surrounding structures in close proximity to the target.   PLAN:  The patient will receive 37.5 Gy in 15 fractions.  ________________________________   Jodelle Gross, MD, PhD

## 2017-08-17 ENCOUNTER — Telehealth: Payer: Self-pay | Admitting: Oncology

## 2017-08-17 ENCOUNTER — Ambulatory Visit
Admission: RE | Admit: 2017-08-17 | Discharge: 2017-08-17 | Disposition: A | Discharge status: Home / Self Care | Payer: Medicare Other | Source: Ambulatory Visit | Attending: Radiation Oncology | Admitting: Radiation Oncology

## 2017-08-17 ENCOUNTER — Encounter: Payer: Self-pay | Admitting: Radiation Oncology

## 2017-08-17 DIAGNOSIS — Z510 Encounter for antineoplastic radiation therapy: Secondary | ICD-10-CM | POA: Diagnosis not present

## 2017-08-17 NOTE — Telephone Encounter (Signed)
Called in prescription for Carafate to [facility] per patient's daughter.  Also called [facility] to cancel prescription there.  Called patient's daughter and let her know that the prescription has been called in to [facility] in [place].

## 2017-08-18 ENCOUNTER — Telehealth: Payer: Self-pay | Admitting: Oncology

## 2017-08-18 ENCOUNTER — Ambulatory Visit: Payer: Medicare Other

## 2017-08-18 NOTE — Telephone Encounter (Signed)
Called patient's daughter, Lorenza Chick, to check on his status.  She said he was very weak and confused this morning.  She said he did not eat or drink very much over the weekend.  They are currently in triage at [place].  Lorenza Chick will call if he will be admitted.

## 2017-08-19 ENCOUNTER — Ambulatory Visit: Payer: Medicare Other

## 2017-08-19 MED ORDER — FERROUS SULFATE 324 (65 FE) MG PO TBEC
325.00 | DELAYED_RELEASE_TABLET | ORAL | Status: DC
Start: 2017-08-20 — End: 2017-08-19

## 2017-08-19 MED ORDER — SODIUM CHLORIDE 0.9 % IV SOLN
INTRAVENOUS | Status: DC
Start: ? — End: 2017-08-19

## 2017-08-19 MED ORDER — LIDOCAINE HCL (PF) 1 % IJ SOLN
0.50 | INTRAMUSCULAR | Status: DC
Start: ? — End: 2017-08-19

## 2017-08-19 MED ORDER — PREDNISONE 5 MG PO TABS
5.00 | ORAL_TABLET | ORAL | Status: DC
Start: 2017-08-20 — End: 2017-08-19

## 2017-08-19 MED ORDER — MELATONIN 3 MG PO TABS
3.00 | ORAL_TABLET | ORAL | Status: DC
Start: 2017-08-19 — End: 2017-08-19

## 2017-08-19 MED ORDER — LEVOTHYROXINE SODIUM 25 MCG PO TABS
25.00 | ORAL_TABLET | ORAL | Status: DC
Start: 2017-08-20 — End: 2017-08-19

## 2017-08-19 MED ORDER — ASPIRIN 81 MG PO CHEW
81.00 | CHEWABLE_TABLET | ORAL | Status: DC
Start: 2017-08-20 — End: 2017-08-19

## 2017-08-19 MED ORDER — GENERIC EXTERNAL MEDICATION
3.38 | Status: DC
Start: 2017-08-19 — End: 2017-08-19

## 2017-08-19 MED ORDER — ACETAMINOPHEN 325 MG PO TABS
650.00 | ORAL_TABLET | ORAL | Status: DC
Start: ? — End: 2017-08-19

## 2017-08-19 MED ORDER — HEPARIN SODIUM (PORCINE) 5000 UNIT/ML IJ SOLN
5000.00 | INTRAMUSCULAR | Status: DC
Start: 2017-08-19 — End: 2017-08-19

## 2017-08-19 MED ORDER — GENERIC EXTERNAL MEDICATION
Status: DC
Start: ? — End: 2017-08-19

## 2017-08-19 MED ORDER — RENAL 1 MG PO CAPS
1.00 | ORAL_CAPSULE | ORAL | Status: DC
Start: 2017-08-20 — End: 2017-08-19

## 2017-08-19 NOTE — Telephone Encounter (Signed)
Thanks [name],  I don't see any notes from [facility] in [place]. Can you make sure he's coming for treatment?

## 2017-09-07 NOTE — Progress Notes (Signed)
  Radiation Oncology         [phone] ________________________________  Name: [name] MRN: [id number]  Date: 08/17/2017  DOB: 1946-01-12  End of Treatment Note  Diagnosis:   72 y.o. male with squamous cell carcinoma in the right lower lobe   Indication for treatment::  palliative       Radiation treatment dates:   07/29/2017 - 08/17/2017  Site/dose:   The patient was to receive 37.5 Gy in 15 fractions to his right lung but elected to stop treatment after his 14th fraction. The total dose received was 35 Gy.   Narrative: The patient tolerated radiation treatment relatively well despite some fatigue and loss of appetite due to nausea and dysphasia. He did lose 8 pounds over the course of treatment. He otherwise denied any skin irritation or changes.  He is feeling very weak and fatigued. His family mentioned that he fell twice at home in the last 2 weeks. He did not go to the ER for either fall. His recent CT scan of the head in December did not reveal any intracranial abnormalities. He attributes this fall to fatigue which hopefully will improve in response to treatment.    Plan: The patient has completed radiation treatment. The patient will return to radiation oncology clinic for routine followup in one month. A CT scan of the chest has been ordered to be completed prior to this appointment. I advised the patient to call or return sooner if they have any questions or concerns related to their recovery or treatment. ________________________________  Jodelle Gross, MD, PhD  This document serves as a record of services personally performed by Kyung Rudd, MD. It was created on his behalf by Rae Lips, a trained medical scribe. The creation of this record is based on the scribe's personal observations and the provider's statements to them. This document has been checked and approved by the attending provider.

## 2017-09-11 ENCOUNTER — Ambulatory Visit (HOSPITAL_COMMUNITY): Payer: BLUE CROSS/BLUE SHIELD

## 2017-09-22 ENCOUNTER — Ambulatory Visit: Admission: RE | Admit: 2017-09-22 | Source: Ambulatory Visit | Admitting: Radiation Oncology

## 2017-09-25 DEATH — deceased

## 2017-11-16 ENCOUNTER — Other Ambulatory Visit: Payer: Self-pay | Admitting: Family Medicine

## 2017-11-27 ENCOUNTER — Other Ambulatory Visit: Payer: Self-pay | Admitting: Family Medicine

## 2017-11-30 ENCOUNTER — Other Ambulatory Visit: Payer: Self-pay | Admitting: Family Medicine

## 2017-12-01 ENCOUNTER — Other Ambulatory Visit: Payer: Self-pay | Admitting: Family Medicine

## 2017-12-22 ENCOUNTER — Other Ambulatory Visit: Payer: Self-pay | Admitting: Family Medicine
